# Patient Record
Sex: Female | Born: 1975 | Race: Black or African American | Hispanic: No | Marital: Married | Smoking: Never smoker
Health system: Southern US, Community
[De-identification: ages and names within clinical notes are randomized; demographics above are authoritative.]

## PROBLEM LIST (undated history)

## (undated) ENCOUNTER — Inpatient Hospital Stay (HOSPITAL_COMMUNITY): Payer: Self-pay

## (undated) DIAGNOSIS — O09529 Supervision of elderly multigravida, unspecified trimester: Secondary | ICD-10-CM

## (undated) DIAGNOSIS — D497 Neoplasm of unspecified behavior of endocrine glands and other parts of nervous system: Secondary | ICD-10-CM

## (undated) HISTORY — DX: Supervision of elderly multigravida, unspecified trimester: O09.529

## (undated) HISTORY — DX: Neoplasm of unspecified behavior of endocrine glands and other parts of nervous system: D49.7

## (undated) HISTORY — PX: NO PAST SURGERIES: SHX2092

---

## 2003-03-15 ENCOUNTER — Ambulatory Visit (HOSPITAL_COMMUNITY): Admission: RE | Admit: 2003-03-15 | Discharge: 2003-03-15 | Payer: Self-pay | Admitting: Internal Medicine

## 2003-03-16 ENCOUNTER — Encounter: Payer: Self-pay | Admitting: Internal Medicine

## 2003-04-09 ENCOUNTER — Ambulatory Visit (HOSPITAL_COMMUNITY): Admission: RE | Admit: 2003-04-09 | Discharge: 2003-04-09 | Payer: Self-pay | Admitting: Internal Medicine

## 2003-04-09 ENCOUNTER — Encounter: Payer: Self-pay | Admitting: Internal Medicine

## 2003-05-17 ENCOUNTER — Other Ambulatory Visit: Admission: RE | Admit: 2003-05-17 | Discharge: 2003-05-17 | Payer: Self-pay

## 2003-07-15 ENCOUNTER — Other Ambulatory Visit: Admission: RE | Admit: 2003-07-15 | Discharge: 2003-07-15 | Payer: Self-pay | Admitting: Family Medicine

## 2006-10-01 ENCOUNTER — Inpatient Hospital Stay (HOSPITAL_COMMUNITY): Admission: AD | Admit: 2006-10-01 | Discharge: 2006-10-01 | Payer: Self-pay | Admitting: Obstetrics & Gynecology

## 2006-10-01 ENCOUNTER — Ambulatory Visit: Payer: Self-pay | Admitting: *Deleted

## 2006-10-01 ENCOUNTER — Ambulatory Visit (HOSPITAL_COMMUNITY): Admission: RE | Admit: 2006-10-01 | Discharge: 2006-10-01 | Payer: Self-pay | Admitting: Obstetrics & Gynecology

## 2006-10-09 ENCOUNTER — Ambulatory Visit: Payer: Self-pay | Admitting: *Deleted

## 2006-10-16 ENCOUNTER — Ambulatory Visit: Payer: Self-pay | Admitting: Obstetrics & Gynecology

## 2006-10-18 ENCOUNTER — Ambulatory Visit: Payer: Self-pay | Admitting: Obstetrics & Gynecology

## 2006-10-23 ENCOUNTER — Ambulatory Visit: Payer: Self-pay | Admitting: Obstetrics and Gynecology

## 2006-10-23 ENCOUNTER — Inpatient Hospital Stay (HOSPITAL_COMMUNITY): Admission: AD | Admit: 2006-10-23 | Discharge: 2006-10-26 | Payer: Self-pay | Admitting: Gynecology

## 2006-10-23 ENCOUNTER — Ambulatory Visit: Payer: Self-pay | Admitting: Obstetrics & Gynecology

## 2006-10-24 ENCOUNTER — Encounter (INDEPENDENT_AMBULATORY_CARE_PROVIDER_SITE_OTHER): Payer: Self-pay | Admitting: Specialist

## 2007-06-17 ENCOUNTER — Ambulatory Visit: Payer: Self-pay | Admitting: Internal Medicine

## 2007-06-17 ENCOUNTER — Encounter (INDEPENDENT_AMBULATORY_CARE_PROVIDER_SITE_OTHER): Payer: Self-pay | Admitting: Nurse Practitioner

## 2007-06-17 LAB — CONVERTED CEMR LAB
ALT: 34 units/L (ref 0–35)
Albumin: 4.2 g/dL (ref 3.5–5.2)
Alkaline Phosphatase: 54 units/L (ref 39–117)
CO2: 18 meq/L — ABNORMAL LOW (ref 19–32)
Potassium: 4 meq/L (ref 3.5–5.3)
Sodium: 141 meq/L (ref 135–145)
Total Bilirubin: 0.5 mg/dL (ref 0.3–1.2)
Total Protein: 7.9 g/dL (ref 6.0–8.3)

## 2007-06-27 ENCOUNTER — Ambulatory Visit: Payer: Self-pay | Admitting: Family Medicine

## 2007-06-27 ENCOUNTER — Encounter (INDEPENDENT_AMBULATORY_CARE_PROVIDER_SITE_OTHER): Payer: Self-pay | Admitting: Nurse Practitioner

## 2007-06-27 LAB — CONVERTED CEMR LAB
HDL: 72 mg/dL (ref >39)
LDL Cholesterol: 107 mg/dL — ABNORMAL HIGH (ref 0–99)
Total CHOL/HDL Ratio: 2.6

## 2007-07-23 ENCOUNTER — Ambulatory Visit: Payer: Self-pay | Admitting: Internal Medicine

## 2007-07-28 ENCOUNTER — Ambulatory Visit: Payer: Self-pay | Admitting: *Deleted

## 2007-08-12 ENCOUNTER — Ambulatory Visit: Payer: Self-pay | Admitting: Family Medicine

## 2007-08-13 ENCOUNTER — Encounter (INDEPENDENT_AMBULATORY_CARE_PROVIDER_SITE_OTHER): Payer: Self-pay | Admitting: Nurse Practitioner

## 2007-08-13 ENCOUNTER — Ambulatory Visit: Payer: Self-pay | Admitting: Internal Medicine

## 2007-08-13 LAB — CONVERTED CEMR LAB
CO2: 22 meq/L (ref 19–32)
Chloride: 108 meq/L (ref 96–112)
Glucose, Bld: 98 mg/dL (ref 70–99)
Lymphocytes Relative: 55 % — ABNORMAL HIGH (ref 12–46)
Lymphs Abs: 2.8 10*3/uL (ref 0.7–4.0)
Neutro Abs: 1.9 10*3/uL (ref 1.7–7.7)
Neutrophils Relative %: 38 % — ABNORMAL LOW (ref 43–77)
Platelets: 234 10*3/uL (ref 150–400)
Potassium: 3.9 meq/L (ref 3.5–5.3)
RBC: 4.19 M/uL (ref 3.87–5.11)
Sodium: 143 meq/L (ref 135–145)
T3, Total: 131.1 ng/dL (ref 80.0–204.0)
WBC: 5 10*3/uL (ref 4.0–10.5)

## 2007-11-11 ENCOUNTER — Ambulatory Visit: Payer: Self-pay | Admitting: Internal Medicine

## 2008-03-12 ENCOUNTER — Ambulatory Visit: Payer: Self-pay | Admitting: Obstetrics & Gynecology

## 2008-08-05 ENCOUNTER — Encounter (INDEPENDENT_AMBULATORY_CARE_PROVIDER_SITE_OTHER): Payer: Self-pay | Admitting: Family Medicine

## 2008-08-05 ENCOUNTER — Ambulatory Visit: Payer: Self-pay | Admitting: Family Medicine

## 2008-08-05 ENCOUNTER — Ambulatory Visit (HOSPITAL_COMMUNITY): Admission: RE | Admit: 2008-08-05 | Discharge: 2008-08-05 | Payer: Self-pay | Admitting: Family Medicine

## 2008-08-05 LAB — CONVERTED CEMR LAB: Whiff Test: NEGATIVE

## 2008-09-16 ENCOUNTER — Encounter: Payer: Self-pay | Admitting: *Deleted

## 2009-01-21 ENCOUNTER — Telehealth: Payer: Self-pay | Admitting: Family Medicine

## 2009-01-24 ENCOUNTER — Ambulatory Visit: Payer: Self-pay | Admitting: Family Medicine

## 2009-01-24 ENCOUNTER — Encounter: Payer: Self-pay | Admitting: Family Medicine

## 2009-01-25 ENCOUNTER — Encounter: Payer: Self-pay | Admitting: *Deleted

## 2009-01-27 ENCOUNTER — Encounter (INDEPENDENT_AMBULATORY_CARE_PROVIDER_SITE_OTHER): Payer: Self-pay | Admitting: Family Medicine

## 2009-01-27 ENCOUNTER — Ambulatory Visit (HOSPITAL_COMMUNITY): Admission: RE | Admit: 2009-01-27 | Discharge: 2009-01-27 | Payer: Self-pay | Admitting: Family Medicine

## 2009-01-31 ENCOUNTER — Encounter: Payer: Self-pay | Admitting: *Deleted

## 2009-02-15 ENCOUNTER — Encounter: Payer: Self-pay | Admitting: Family Medicine

## 2009-02-15 ENCOUNTER — Ambulatory Visit: Payer: Self-pay | Admitting: Family Medicine

## 2009-02-15 DIAGNOSIS — E039 Hypothyroidism, unspecified: Secondary | ICD-10-CM | POA: Insufficient documentation

## 2009-02-15 LAB — CONVERTED CEMR LAB
HCT: 32.7 % — ABNORMAL LOW (ref 36.0–46.0)
MCHC: 33 g/dL (ref 30.0–36.0)
MCV: 92.1 fL (ref 78.0–100.0)
Platelets: 115 10*3/uL — ABNORMAL LOW (ref 150–400)
RDW: 13.6 % (ref 11.5–15.5)
TSH: 0.659 microintl units/mL (ref 0.350–4.500)
WBC: 6.1 10*3/uL (ref 4.0–10.5)

## 2009-02-17 ENCOUNTER — Encounter: Payer: Self-pay | Admitting: Family Medicine

## 2009-02-17 ENCOUNTER — Telehealth: Payer: Self-pay | Admitting: Family Medicine

## 2009-02-17 ENCOUNTER — Encounter (INDEPENDENT_AMBULATORY_CARE_PROVIDER_SITE_OTHER): Payer: Self-pay | Admitting: *Deleted

## 2009-03-01 ENCOUNTER — Encounter: Payer: Self-pay | Admitting: Family Medicine

## 2009-03-01 ENCOUNTER — Ambulatory Visit: Payer: Self-pay | Admitting: Family Medicine

## 2009-03-07 ENCOUNTER — Encounter: Payer: Self-pay | Admitting: *Deleted

## 2009-03-08 ENCOUNTER — Ambulatory Visit: Payer: Self-pay | Admitting: Family Medicine

## 2009-03-15 ENCOUNTER — Ambulatory Visit: Payer: Self-pay | Admitting: Family Medicine

## 2009-03-15 ENCOUNTER — Encounter: Payer: Self-pay | Admitting: Family Medicine

## 2009-03-15 DIAGNOSIS — N942 Vaginismus: Secondary | ICD-10-CM | POA: Insufficient documentation

## 2009-03-16 ENCOUNTER — Encounter: Payer: Self-pay | Admitting: *Deleted

## 2009-03-23 ENCOUNTER — Ambulatory Visit: Payer: Self-pay | Admitting: Family Medicine

## 2009-03-29 ENCOUNTER — Encounter: Payer: Self-pay | Admitting: Family Medicine

## 2009-03-29 ENCOUNTER — Ambulatory Visit: Payer: Self-pay | Admitting: Obstetrics & Gynecology

## 2009-03-30 ENCOUNTER — Inpatient Hospital Stay (HOSPITAL_COMMUNITY): Admission: AD | Admit: 2009-03-30 | Discharge: 2009-04-01 | Payer: Self-pay | Admitting: Obstetrics & Gynecology

## 2009-03-30 ENCOUNTER — Ambulatory Visit: Payer: Self-pay | Admitting: Advanced Practice Midwife

## 2009-03-30 ENCOUNTER — Ambulatory Visit: Payer: Self-pay | Admitting: Family Medicine

## 2009-04-06 ENCOUNTER — Telehealth: Payer: Self-pay | Admitting: *Deleted

## 2009-05-18 ENCOUNTER — Ambulatory Visit: Payer: Self-pay | Admitting: Family Medicine

## 2009-05-20 ENCOUNTER — Ambulatory Visit: Payer: Self-pay | Admitting: Family Medicine

## 2009-05-26 ENCOUNTER — Ambulatory Visit: Payer: Self-pay | Admitting: Family Medicine

## 2009-09-13 ENCOUNTER — Encounter: Admission: RE | Admit: 2009-09-13 | Discharge: 2009-09-13 | Payer: Self-pay | Admitting: Gastroenterology

## 2010-01-03 ENCOUNTER — Ambulatory Visit: Payer: Self-pay | Admitting: Family Medicine

## 2010-01-08 ENCOUNTER — Encounter: Payer: Self-pay | Admitting: Family Medicine

## 2010-01-22 ENCOUNTER — Encounter: Payer: Self-pay | Admitting: Family Medicine

## 2010-04-18 ENCOUNTER — Ambulatory Visit: Payer: Self-pay | Admitting: Family Medicine

## 2010-05-26 ENCOUNTER — Encounter: Payer: Self-pay | Admitting: Family Medicine

## 2010-07-11 ENCOUNTER — Encounter
Admission: RE | Admit: 2010-07-11 | Discharge: 2010-07-11 | Payer: Self-pay | Source: Home / Self Care | Attending: Obstetrics & Gynecology | Admitting: Obstetrics & Gynecology

## 2010-08-05 ENCOUNTER — Encounter: Payer: Self-pay | Admitting: Internal Medicine

## 2010-08-10 ENCOUNTER — Other Ambulatory Visit: Payer: Self-pay | Admitting: Neurosurgery

## 2010-08-10 DIAGNOSIS — D497 Neoplasm of unspecified behavior of endocrine glands and other parts of nervous system: Secondary | ICD-10-CM

## 2010-08-13 LAB — CONVERTED CEMR LAB
Basophils Absolute: 0 10*3/uL (ref 0.0–0.1)
Chlamydia, DNA Probe: NEGATIVE
Hemoglobin: 12 g/dL (ref 12.0–15.0)
Lymphocytes Relative: 46 % (ref 12–46)
Lymphs Abs: 2.4 10*3/uL (ref 0.7–4.0)
Monocytes Absolute: 0.4 10*3/uL (ref 0.1–1.0)
Neutro Abs: 2.4 10*3/uL (ref 1.7–7.7)
RBC: 4.16 M/uL (ref 3.87–5.11)
RDW: 13.5 % (ref 11.5–15.5)
Rubella: 190.5 intl units/mL — ABNORMAL HIGH
WBC: 5.2 10*3/uL (ref 4.0–10.5)

## 2010-08-15 NOTE — Miscellaneous (Signed)
Summary: Changing prob list   Clinical Lists Changes  Problems: Removed problem of ABDOMINAL PAIN, EPIGASTRIC (ICD-789.06) Removed problem of TB OF SKIN&SUBCUTANEOUS CELLULAR TISSUE-OTH TEST (ICD-017.06) Removed problem of POSTPARTUM EXAMINATION, NORMAL (ICD-V24.2) Removed problem of BREAST PAIN, LEFT (ICD-611.71) Removed problem of UTERINE SIZE DATE DISCREPANCY ANTPRTM COND/COMPL (AOZ-308.65) Removed problem of PREGNANCY, NORMAL, MULTIGRAVIDA (ICD-V22.1) Removed problem of PELVIC  PAIN (ICD-789.09) Removed problem of AMENORRHEA (ICD-626.0) Removed problem of FEMALE INFERTILITY (ICD-628.9)

## 2010-08-15 NOTE — Assessment & Plan Note (Signed)
Summary: flu shot/eo  Nurse Visit  FLU SHOT GIVEN TODAY.Jimmy Footman, CMA  April 18, 2010 12:03 PM   Vitals Entered By: Jimmy Footman, CMA (April 18, 2010 12:02 PM) CC: flu shot   Allergies: No Known Drug Allergies  Immunizations Administered:  Influenza Vaccine # 1:    Vaccine Type: Fluvax Non-MCR    Site: right deltoid    Mfr: Sanofi Pasteur    Dose: 0.5 ml    Route: IM    Given by: Jimmy Footman, CMA    Exp. Date: 01/10/2011    Lot #: [id number]    VIS given: 02/07/10 version given April 18, 2010.  Flu Vaccine Consent Questions:    Do you have a history of severe allergic reactions to this vaccine? no    Any prior history of allergic reactions to egg and/or gelatin? no    Do you have a sensitivity to the preservative Thimersol? no    Do you have a past history of Guillan-Barre Syndrome? no    Do you currently have an acute febrile illness? no    Have you ever had a severe reaction to latex? no    Vaccine information given and explained to patient? yes    Are you currently pregnant? no  Orders Added: 1)  Influenza Vaccine NON MCR [00028] 2)  Admin 1st Vaccine [25956]

## 2010-08-15 NOTE — Assessment & Plan Note (Signed)
Summary: Fertility   Vital Signs:  Patient profile:   35 year old female Height:      66 inches Weight:      148 pounds BMI:     23.97 Temp:     97.5 degrees F oral Pulse rate:   81 / minute BP sitting:   106 / 65  (left arm) Cuff size:   regular  Vitals Entered By: Tessie Fass CMA (January 03, 2010 2:15 PM) CC: Fertility treatment Is Patient Diabetic? No Pain Assessment Patient in pain? no        Primary Care Provider:  Dreonna Hussein MD  CC:  Fertility treatment.  History of Present Illness: 35 y/o G2P2002 would like to discuss fertility.  She reports taking medicine in [place] to conceive.  She conceived previous 2 pregnancies by what seems like Clomid and bromocriptine.  She has a history of multinodule goitor, but has been off of thyroid medications for years.  Her husband has 2 children from previous marriage, so most likley not the source of fertility.  Pt would like me to prescribe medicine to help her conceive.  She is not sure when she would like to try to have another baby.  She goes from saying when youngest child, Jeani Hawking, is 15 y/o (currently 9-mo) to saying that she my try now.    No periods since  delivery in 03/2009.   Pt is still breast feeding.    Habits & Providers  Alcohol-Tobacco-Diet     Tobacco Status: never  Current Medications (verified): 1)  Zantac 150 Mg Tabs (Ranitidine Hcl) .... One Tablet By Mouth After Meal As Needed Heartburn 2)  P D Natal Vitamins/folic Acid  Tabs (Prenatal Multivit-Min-Fe-Fa) .... One Tablet By Mouth Daily 3)  Iron 325 (65 Fe) Mg Tabs (Ferrous Sulfate) .... One Tablet By Mouth Two Times A Day For Anemia 4)  Colace 100 Mg Caps (Docusate Sodium) .... One Tablet By Mouth Two Times A Day For Constipation 5)  Nu-Iron 150 Mg Caps (Polysaccharide Iron Complex) .... One Tablet By Mouth Daily  Allergies (verified): No Known Drug Allergies  Past History:  Social History: Last updated: 08/05/2008 Lives with husband and daughter Berline Chough.  From [place] Non smoker, nondrinker, no drugs  Risk Factors: Smoking Status: never (01/03/2010) Packs/Day: n/a (03/30/2009)  Past Medical History: [id number] NSVD at term without complications Hstory of Multinodular goitor History of conception through Clomid (in [place])  Physical Exam  General:  Well-developed,well-nourished,in no acute distress; alert,appropriate and cooperative throughout examination Neck:  + goitor Lungs:  Normal respiratory effort, chest expands symmetrically. Lungs are clear to auscultation, no crackles or wheezes. Heart:  Normal rate and regular rhythm. S1 and S2 normal without gallop, murmur, click, rub or other extra sounds. Neurologic:  alert & oriented X3.   Psych:  Cognition and judgment appear intact. Alert and cooperative with normal attention span and concentration. No apparent delusions, illusions, hallucinations   Impression & Recommendations:  Problem # 1:  FEMALE INFERTILITY (ICD-628.9) Pt would like assistance in conceiving.  It seems she may have been treated with Clomid in [place], each time she conceived.  She was followed by first pregnancy through health dept and 2[facility] pregnancy at [facility].  She delivered by NSVD x 2 withoiut complications.  I discussed at length that I am no comfortable prescribing clomid.  We discussed increased risk of multiple conceptions/birts with Clomid.  Pt states she knows this.  Told pt that I thought she would be better  served being followed by OBGYN or fertility specialist.  I would be happy to do referral.  Pt agreed.  In the meantime I would like pt to conintue pnvwith folic acid.  Will refill today.   will call pt for appt after referral is made.   Orders: [facility]- Est Level  3 ([address])  Problem # 2:  HYPOTHYROIDISM (ICD-244.9) I would like to check TSH, esp if pt plans on trying to become pregnant again.   Future Orders: TSH-[facility] ([id number]) ... 12/23/2010  Complete Medication List: 1)  Zantac 150 Mg Tabs  (Ranitidine hcl) .... One tablet by mouth after meal as needed heartburn 2)  P D Natal Vitamins/folic Acid Tabs (Prenatal multivit-min-fe-fa) .... One tablet by mouth daily 3)  Iron 325 (65 Fe) Mg Tabs (Ferrous sulfate) .... One tablet by mouth two times a day for anemia 4)  Colace 100 Mg Caps (Docusate sodium) .... One tablet by mouth two times a day for constipation 5)  Nu-iron 150 Mg Caps (Polysaccharide iron complex) .... One tablet by mouth daily  Patient Instructions: 1)  Please schedule a follow-up appointment as needed if you want to discuss prenatal issues .  2)  SITZ bath 3)  Preparation H Prescriptions: P D NATAL VITAMINS/FOLIC ACID  TABS (PRENATAL MULTIVIT-MIN-FE-FA) one tablet by mouth daily  #30 x 12   Entered and Authorized by:   Angeline Slim MD   Signed by:   Angeline Slim MD on 01/03/2010   Method used:   Electronically to        [facility]. [phone]* (retail)       [address]       [place], [place]  [address]       Ph: [phone]       Fax: [phone]   RxID:   [id number]

## 2010-08-15 NOTE — Letter (Signed)
Summary: Generic Letter  [facility]  [address]   [place], [place] [address]   Phone: [phone]  Fax: [phone]    01/22/2010  [facility] [address] [place], [place]  [address]  To whom it may concern:  I am writing on behalf of my patient, Shelley Hardy.  Please be aware that she has a daugther, Judyann Munson, who has asthma.       Sincerely,   Bria Sparr MD  Appended Document: Generic Letter mailed

## 2010-08-15 NOTE — Letter (Signed)
Summary: Generic Letter: Fertility referral  [facility]  [address]   [place], [place] [address]   Phone: [phone]  Fax: [phone]    01/08/2010  [facility] [address] [place], [place]  [address]  Dear Shelley Hardy,  Per our conversation during your last office visit, I am sending you my recommendations for you to choose from for Fertility Care.  These practices have a few female OBGYN for you to choose from.  Please call them to schedule an appointment at your earliest convenience.    [address] [place], [place] [phone]   [facility] [address] [place], [place] [address] [phone]  Sincerely,   Gorden Stthomas MD  Appended Document: Generic Letter: Fertility referral mailed

## 2010-10-20 LAB — CBC
HCT: 30.4 % — ABNORMAL LOW (ref 36.0–46.0)
HCT: 37.3 % (ref 36.0–46.0)
MCHC: 34.6 g/dL (ref 30.0–36.0)
MCV: 94.2 fL (ref 78.0–100.0)
Platelets: 82 10*3/uL — ABNORMAL LOW (ref 150–400)
Platelets: 93 10*3/uL — ABNORMAL LOW (ref 150–400)
RDW: 13.6 % (ref 11.5–15.5)
RDW: 13.7 % (ref 11.5–15.5)
WBC: 9.9 10*3/uL (ref 4.0–10.5)
WBC: 6.7 10*3/uL (ref 4.0–10.5)

## 2010-10-20 LAB — RPR: RPR Ser Ql: NONREACTIVE

## 2010-10-21 LAB — GLUCOSE, CAPILLARY: Glucose-Capillary: 121 mg/dL — ABNORMAL HIGH (ref 70–99)

## 2010-11-12 ENCOUNTER — Inpatient Hospital Stay (HOSPITAL_COMMUNITY): Payer: Medicaid Other

## 2010-11-12 ENCOUNTER — Inpatient Hospital Stay (HOSPITAL_COMMUNITY)
Admission: AD | Admit: 2010-11-12 | Discharge: 2010-11-12 | Disposition: A | Discharge status: Home / Self Care | Payer: Medicaid Other | Source: Ambulatory Visit | Attending: Family Medicine | Admitting: Family Medicine

## 2010-11-12 ENCOUNTER — Inpatient Hospital Stay (HOSPITAL_COMMUNITY)
Admission: AD | Admit: 2010-11-12 | Discharge: 2010-11-12 | Disposition: A | Discharge status: Home / Self Care | Payer: Medicaid Other | Source: Ambulatory Visit | Attending: Obstetrics and Gynecology | Admitting: Obstetrics and Gynecology

## 2010-11-12 DIAGNOSIS — O209 Hemorrhage in early pregnancy, unspecified: Secondary | ICD-10-CM | POA: Insufficient documentation

## 2010-11-12 LAB — WET PREP, GENITAL: Clue Cells Wet Prep HPF POC: NONE SEEN

## 2010-11-12 LAB — URINALYSIS, ROUTINE W REFLEX MICROSCOPIC
Bilirubin Urine: NEGATIVE
Ketones, ur: NEGATIVE mg/dL
Nitrite: NEGATIVE
Protein, ur: 30 mg/dL — AB
pH: 6.5 (ref 5.0–8.0)

## 2010-11-12 LAB — CBC
Hemoglobin: 12.1 g/dL (ref 12.0–15.0)
Hemoglobin: 12 g/dL (ref 12.0–15.0)
MCHC: 32.4 g/dL (ref 30.0–36.0)
MCHC: 33.5 g/dL (ref 30.0–36.0)
RDW: 12.6 % (ref 11.5–15.5)
RDW: 12.6 % (ref 11.5–15.5)
WBC: 3.9 10*3/uL — ABNORMAL LOW (ref 4.0–10.5)
WBC: 6 10*3/uL (ref 4.0–10.5)

## 2010-11-12 LAB — URINE MICROSCOPIC-ADD ON

## 2010-11-12 LAB — ABO/RH: ABO/RH(D): A POS

## 2010-11-12 LAB — HCG, QUANTITATIVE, PREGNANCY: hCG, Beta Chain, Quant, S: 111 m[IU]/mL — ABNORMAL HIGH (ref <5)

## 2010-11-12 LAB — POCT PREGNANCY, URINE: Preg Test, Ur: POSITIVE

## 2010-11-13 LAB — GC/CHLAMYDIA PROBE AMP, GENITAL: Chlamydia, DNA Probe: NEGATIVE

## 2010-11-14 ENCOUNTER — Inpatient Hospital Stay (HOSPITAL_COMMUNITY)
Admission: AD | Admit: 2010-11-14 | Discharge: 2010-11-14 | Disposition: A | Discharge status: Home / Self Care | Payer: Medicaid Other | Source: Ambulatory Visit | Attending: Obstetrics & Gynecology | Admitting: Obstetrics & Gynecology

## 2010-11-14 DIAGNOSIS — O039 Complete or unspecified spontaneous abortion without complication: Secondary | ICD-10-CM

## 2010-11-14 LAB — HCG, QUANTITATIVE, PREGNANCY: hCG, Beta Chain, Quant, S: 15 m[IU]/mL — ABNORMAL HIGH (ref <5)

## 2010-11-28 NOTE — Group Therapy Note (Signed)
NAME:  Shelley Hardy, Shelley Hardy NO.:  [id number]   MEDICAL RECORD NO.:  [id number]          PATIENT TYPE:  WOC   LOCATION:  [facility]                   FACILITY:  [facility]   PHYSICIAN:  Elsie Lincoln, MD      DATE OF BIRTH:  [birth year]   DATE OF SERVICE:  03/12/2008                                  CLINIC NOTE   HISTORY OF PRESENT ILLNESS:  The patient is a 35 year old female who  presents for infertility.  She has been trying to get pregnant for a  year.  The patient had a baby with [place] approximately 17 months ago and is  still breast-feeding.  Of note, she does have a history of  hyperprolactinemia, although this is not in her prenatal records.  She  had noted discharge from her nipples before she became pregnant and was  on what sounds like bromocriptine and maybe even Clomid to get pregnant  with this pregnancy in [place].  The patient also has a  history of multinodular goiter.  She states she had a TSH drawn at  [facility] 3 months ago, and it was normal.  She denies any other  medical problems.  Her husband has fathered 2 other children, so I do  not think there is a semen issue.  The patient has had some irregular  periods this year.  This could be due to the breast-feeding or due to  other endocrine problem.  The patient would like to stop breast-feeding,  but she feels that at night it is very difficult.  We gave her some  pointers on how to isolate her breasts and not feed the baby several  times in the night.  Not only is this bad forsleep habits, but also her  baby's teeth having milk exposed.  The patient has agreed to quit breast-  feeding for a month and come back for a prolactin and TSH.  If these are  abnormal, most likely we will have to refer them to an endocrinologist  to help [place] with this situation.  She did pay $200  today for her  infertility workup and the labs should be included in that.  She is up-  to-date for Pap smear per the patient at  [facility].   MEDICATIONS:  Prenatal vitamins and ibuprofen.   ALLERGIES:  None familial.   FAMILY HISTORY:  No history of ovarian cancer, breast cancer,  endometrial cancer, or colon cancer.   ASSESSMENT/PLAN:  A 31-year female with infertility.  1. Stop breast-feeding.  2. Will come back in 1 month for TSH and prolactin.  3. Will call with results.  4. As necessary.           ______________________________  Elsie Lincoln, MD     KL/MEDQ  D:  03/12/2008  T:  03/12/2008  Job:  [id number]

## 2010-11-29 ENCOUNTER — Encounter (INDEPENDENT_AMBULATORY_CARE_PROVIDER_SITE_OTHER): Payer: Medicaid Other | Admitting: Obstetrics and Gynecology

## 2010-11-29 DIAGNOSIS — N643 Galactorrhea not associated with childbirth: Secondary | ICD-10-CM

## 2010-11-29 DIAGNOSIS — O039 Complete or unspecified spontaneous abortion without complication: Secondary | ICD-10-CM

## 2010-11-30 NOTE — Group Therapy Note (Signed)
NAME:  Shelley Hardy, Shelley Hardy NO.:  [id number]  MEDICAL RECORD NO.:  [id number]           PATIENT TYPE:  A  LOCATION:  [facility]                   FACILITY:  [facility]  PHYSICIAN:  Argentina Donovan, MD        DATE OF BIRTH:  [birth year]  DATE OF SERVICE:  11/29/2010                                 CLINIC NOTE  The patient is a 35 year old Arab female from the [place], gravida 3, para 2-0-1-2, who recently had a spontaneous miscarriage, went into the MAU on April 29 and had an ultrasound, at that time, it looked like a failed pregnancy, went back on May 4 and found out that she had a beta HCG of 15.  The patient has stopped bleeding, however, she starting to have milk in her breasts.  Interestingly enough several months ago, she began having galactorrhea.  She went and saw Dr. Seymour Bars who sent her for an MRI for the pituitary microadenoma and placed her on medication.  I am not sure which medication they put her on exactly, whether it was bromocriptine, Parlodel, or what.  In any case, she is going to bring in the bottle the next time she comes.  She is known in the past to be hypothyroid, although, she is not on any thyroid medication.  1. I am going to get quantitative beta to make sure it is down to     zero. 2. Repeat her prolactin. 3. Check her thyroid.  I am going to have her come back in 2 weeks.  In 3 days, she is to have that she has previously scheduled for repeat MRI.  The last prolactin apparently she had was normal.  However, once she found out that she was pregnant, she stop the medication she was taking for the microadenoma and she has already had one MRI that showed 10 x 6 x 10 hypo-enhancing mass consistent with pituitary adenoma, so we will see what the prolactin shows. We will follow her up on the level and if there is a significant change and it has gotten larger, I think we will probably refer her to a Neurosurgeon.  IMPRESSION:  Microadenoma of the pituitary  gland, possible hypothyroid, complete abortion.          ______________________________ Argentina Donovan, MD    PR/MEDQ  D:  11/29/2010  T:  11/30/2010  Job:  [id number]

## 2010-12-07 ENCOUNTER — Other Ambulatory Visit: Payer: Self-pay

## 2010-12-14 ENCOUNTER — Ambulatory Visit (INDEPENDENT_AMBULATORY_CARE_PROVIDER_SITE_OTHER): Payer: Medicaid Other | Admitting: Obstetrics and Gynecology

## 2010-12-14 DIAGNOSIS — D352 Benign neoplasm of pituitary gland: Secondary | ICD-10-CM

## 2010-12-14 DIAGNOSIS — D353 Benign neoplasm of craniopharyngeal duct: Secondary | ICD-10-CM

## 2010-12-15 NOTE — Group Therapy Note (Signed)
NAMEMarland Hardy  [name], [name] NO.:  [id number]  MEDICAL RECORD NO.:  [id number]           PATIENT TYPE:  A  [facility]ATION:  [facility]                   FACILITY:  [facility]  PHYSICIAN:  Argentina Donovan, MD        DATE OF BIRTH:  April 10, 1976  DATE OF SERVICE:  12/14/2010                                 CLINIC NOTE  The patient is a 35 year old [place] lady, gravida 3, para 2-0-1-2 who had a miscarriage in April who has then recently had a quantitative beta of less than 2.  She thought she was hypothyroid, but her T4 and T3's were absolutely normal, so she is euthyroid.  However, her prolactin level was 97.2, it had been about 1500.  She thinks when she was at Dr. Sharol Roussel office prior to her pregnancy when they started her on bromocriptine.  They gave her 12.5 mg a day for a week and then she was able to take the 25.  We are going to do the same thing.  We are going to start her for 2 weeks on 12.5, then put her back on 25.  We will go ahead and continue doing that until she starts ovulate and I told her not to get pregnant until she has had a period or two on her own, and then to stop the prolactin when she is pregnant.  They would not do her a repeat MRI with her Medicaid because I think that it was too soon after the previous one, so I would repeat it either in a year or after her pregnancy, if she is looking off to get pregnant quickly.  Impression is microadenoma of the pituitary gland.          ______________________________ Argentina Donovan, MD    PR/MEDQ  D:  12/14/2010  T:  12/15/2010  Job:  [id number]

## 2010-12-20 ENCOUNTER — Ambulatory Visit
Admission: RE | Admit: 2010-12-20 | Discharge: 2010-12-20 | Disposition: A | Discharge status: Home / Self Care | Payer: Medicaid Other | Source: Ambulatory Visit | Attending: Neurosurgery | Admitting: Neurosurgery

## 2010-12-20 DIAGNOSIS — D497 Neoplasm of unspecified behavior of endocrine glands and other parts of nervous system: Secondary | ICD-10-CM

## 2011-02-23 ENCOUNTER — Other Ambulatory Visit: Payer: Self-pay | Admitting: Family Medicine

## 2011-02-25 NOTE — Telephone Encounter (Signed)
Refill request

## 2011-05-25 ENCOUNTER — Other Ambulatory Visit: Payer: Medicaid Other

## 2011-05-25 LAB — POCT PREGNANCY, URINE: Preg Test, Ur: POSITIVE

## 2011-05-28 ENCOUNTER — Inpatient Hospital Stay (HOSPITAL_COMMUNITY)
Admission: AD | Admit: 2011-05-28 | Discharge: 2011-05-28 | Disposition: A | Discharge status: Home / Self Care | Payer: Medicaid Other | Source: Ambulatory Visit | Attending: Obstetrics & Gynecology | Admitting: Obstetrics & Gynecology

## 2011-05-28 ENCOUNTER — Inpatient Hospital Stay (HOSPITAL_COMMUNITY): Payer: Medicaid Other

## 2011-05-28 ENCOUNTER — Encounter (HOSPITAL_COMMUNITY): Payer: Self-pay

## 2011-05-28 DIAGNOSIS — R109 Unspecified abdominal pain: Secondary | ICD-10-CM | POA: Insufficient documentation

## 2011-05-28 DIAGNOSIS — O039 Complete or unspecified spontaneous abortion without complication: Secondary | ICD-10-CM | POA: Insufficient documentation

## 2011-05-28 LAB — CBC
HCT: 36.7 % (ref 36.0–46.0)
MCH: 29.7 pg (ref 26.0–34.0)
MCHC: 33 g/dL (ref 30.0–36.0)
MCV: 90 fL (ref 78.0–100.0)
RDW: 13.4 % (ref 11.5–15.5)

## 2011-05-28 LAB — URINALYSIS, ROUTINE W REFLEX MICROSCOPIC
Bilirubin Urine: NEGATIVE
Ketones, ur: NEGATIVE mg/dL
Nitrite: NEGATIVE
Protein, ur: NEGATIVE mg/dL
Urobilinogen, UA: 0.2 mg/dL (ref 0.0–1.0)

## 2011-05-28 LAB — URINE MICROSCOPIC-ADD ON

## 2011-05-28 LAB — WET PREP, GENITAL: Clue Cells Wet Prep HPF POC: NONE SEEN

## 2011-05-28 NOTE — ED Provider Notes (Signed)
Attestation of Attending Supervision of Advanced Practitioner: Evaluation and management procedures were performed by the PA/NP/CNM/OB Fellow under my supervision/collaboration. Chart reviewed, and agree with management and plan.  Damonique Brunelle, M.D. 05/28/2011 1:15 PM   

## 2011-05-28 NOTE — ED Provider Notes (Signed)
History   Pt presents today c/o lower abd pain and vag bleeding that began yesterday. She states she also passed a large clot yesterday and she is concerned that she may be having another miscarriage. She denies fever, vag irritation, or any other sx at this time.   Chief Complaint  Patient presents with  . Vaginal Bleeding   HPI  OB History    Grav Para Term Preterm Abortions TAB SAB Ect Mult Living   4 2 2  0 1 0 1 0 0 2      History reviewed. No pertinent past medical history.  History reviewed. No pertinent past surgical history.  History reviewed. No pertinent family history.  History  Substance Use Topics  . Smoking status: Never Smoker   . Smokeless tobacco: Never Used  . Alcohol Use: No    Allergies: No Known Allergies  Prescriptions prior to admission  Medication Sig Dispense Refill  . bromocriptine (PARLODEL) 2.5 MG tablet Take 1.25 mg by mouth daily.        . Prenatal Multivit-Min-Fe-FA (P D NATAL VITAMINS/FOLIC ACID PO) Take by mouth daily.          Review of Systems  Constitutional: Negative for fever.  Gastrointestinal: Positive for abdominal pain. Negative for nausea, vomiting, diarrhea and constipation.  Genitourinary: Negative for dysuria, urgency and frequency.  Neurological: Negative for dizziness and headaches.  Psychiatric/Behavioral: Negative for depression and suicidal ideas.   Physical Exam   Blood pressure 124/79, pulse 80, temperature 98.5 F (36.9 C), temperature source Oral, resp. rate 16, height 5\' 6"  (1.676 m), weight 168 lb 9.6 oz (76.476 kg), last menstrual period 04/22/2011, SpO2 99.00%.  Physical Exam  Nursing note and vitals reviewed. Constitutional: She is oriented to person, place, and time. She appears well-developed and well-nourished. No distress.  HENT:  Head: Normocephalic and atraumatic.  Eyes: EOM are normal. Pupils are equal, round, and reactive to light.  GI: Soft. She exhibits no distension. There is no tenderness.  There is no rebound and no guarding.  Genitourinary: There is bleeding around the vagina. No vaginal discharge found.       Cervix Lg/closed. Moderate amount of bleeding noted.   Neurological: She is alert and oriented to person, place, and time.  Skin: Skin is warm and dry. She is not diaphoretic.  Psychiatric: She has a normal mood and affect. Her behavior is normal. Judgment and thought content normal.    MAU Course  Procedures  Wet prep and GC/Chlamydia cultures done.  Results for orders placed during the hospital encounter of 05/28/11 (from the past 24 hour(s))  URINALYSIS, ROUTINE W REFLEX MICROSCOPIC     Status: Abnormal   Collection Time   05/28/11  9:15 AM      Component Value Range   Color, Urine YELLOW  YELLOW    Appearance CLEAR  CLEAR    Specific Gravity, Urine 1.020  1.005 - 1.030    pH 7.5  5.0 - 8.0    Glucose, UA NEGATIVE  NEGATIVE (mg/dL)   Hgb urine dipstick LARGE (*) NEGATIVE    Bilirubin Urine NEGATIVE  NEGATIVE    Ketones, ur NEGATIVE  NEGATIVE (mg/dL)   Protein, ur NEGATIVE  NEGATIVE (mg/dL)   Urobilinogen, UA 0.2  0.0 - 1.0 (mg/dL)   Nitrite NEGATIVE  NEGATIVE    Leukocytes, UA TRACE (*) NEGATIVE   URINE MICROSCOPIC-ADD ON     Status: Normal   Collection Time   05/28/11  9:15 AM  Component Value Range   Squamous Epithelial / LPF RARE  RARE    WBC, UA 0-2  <3 (WBC/hpf)   RBC / HPF 21-50  <3 (RBC/hpf)  HCG, QUANTITATIVE, PREGNANCY     Status: Normal   Collection Time   05/28/11  9:20 AM      Component Value Range   hCG, Beta Chain, Quant, S 1  <5 (mIU/mL)  CBC     Status: Normal   Collection Time   05/28/11  9:20 AM      Component Value Range   WBC 4.5  4.0 - 10.5 (K/uL)   RBC 4.08  3.87 - 5.11 (MIL/uL)   Hemoglobin 12.1  12.0 - 15.0 (g/dL)   HCT 16.1  09.6 - 04.5 (%)   MCV 90.0  78.0 - 100.0 (fL)   MCH 29.7  26.0 - 34.0 (pg)   MCHC 33.0  30.0 - 36.0 (g/dL)   RDW 40.9  81.1 - 91.4 (%)   Platelets 213  150 - 400 (K/uL)  WET PREP, GENITAL      Status: Abnormal   Collection Time   05/28/11  9:34 AM      Component Value Range   Yeast, Wet Prep NONE SEEN  NONE SEEN    Trich, Wet Prep NONE SEEN  NONE SEEN    Clue Cells, Wet Prep NONE SEEN  NONE SEEN    WBC, Wet Prep HPF POC FEW (*) NONE SEEN    US shows no IUP or adnexal masses.  Assessment and Plan  Complete AB: pt had a positive urine preg test in the GYN clinic and now has a B-quant of 1. Discussed with pt at length. She will f/u in the infertility clinic. Discussed diet, activity, risks, and precautions.  Clinton Gallant. Lathyn Griggs III, DrHSc, MPAS, PA-C  05/28/2011, 9:34 AM   Henrietta Hoover, PA 05/28/11 1036

## 2011-05-28 NOTE — Progress Notes (Signed)
Patient states she started bleeding and having cramping yesterday, passed a clot yesterday. Bleeding and cramping continue. Pt states she had a SAB six months ago.

## 2011-05-29 LAB — GC/CHLAMYDIA PROBE AMP, GENITAL: Chlamydia, DNA Probe: NEGATIVE

## 2011-07-05 ENCOUNTER — Encounter: Payer: Self-pay | Admitting: Family

## 2011-07-05 ENCOUNTER — Ambulatory Visit (INDEPENDENT_AMBULATORY_CARE_PROVIDER_SITE_OTHER): Payer: Medicaid Other | Admitting: Family

## 2011-07-05 VITALS — BP 120/78 | HR 81 | Temp 97.7°F | Ht 66.0 in | Wt 170.4 lb

## 2011-07-05 DIAGNOSIS — O0990 Supervision of high risk pregnancy, unspecified, unspecified trimester: Secondary | ICD-10-CM

## 2011-07-05 DIAGNOSIS — E039 Hypothyroidism, unspecified: Secondary | ICD-10-CM

## 2011-07-05 DIAGNOSIS — E079 Disorder of thyroid, unspecified: Secondary | ICD-10-CM

## 2011-07-05 LAB — POCT PREGNANCY, URINE: Preg Test, Ur: POSITIVE

## 2011-07-05 MED ORDER — BROMOCRIPTINE MESYLATE 2.5 MG PO TABS: 2.5000 mg | ORAL_TABLET | Freq: Every day | ORAL | Status: DC

## 2011-07-05 NOTE — Progress Notes (Signed)
Pt states may be pregnant.

## 2011-07-05 NOTE — Progress Notes (Signed)
  Subjective:    Patient ID: Shelley Hardy, female    DOB: Mar 25, 1976, 35 y.o.   MRN: [id number]  HPI Seen in MAU on 11/12 and diagnosed with complete miscarriage;  BHCG was 1 on that day.  Pt here today reporting another pregnancy, believes she conceived on 06/13/11.  Denies pelvic pain or vaginal bleeding.   Review of Systems No current problems.    Objective:   Physical Exam  Constitutional: She is oriented to person, place, and time. She appears well-developed and well-nourished. No distress.  HENT:  Head: Normocephalic.  Neck: Normal range of motion. Neck supple.  Cardiovascular: Normal rate and regular rhythm.   Pulmonary/Chest: Effort normal and breath sounds normal.  Abdominal: Soft. There is no tenderness.  Neurological: She is alert and oriented to person, place, and time.          Assessment & Plan:  Pregnancy  Plan: Obtain BHCG F/U in MAU for any pelvic pain or vaginal bleeding.

## 2011-07-05 NOTE — Patient Instructions (Signed)
Pregnancy and Medications Most of the time, medicine a pregnant woman is taking does not enter the fetus, but sometimes it can. This may cause damage or birth defects. The risk of damage being done to a fetus is the greatest in the first few weeks of pregnancy. This is when major organs are developing. If you are taking any prescription, over-the-counter or herbal medicines, it is best to talk to your caregiver about the medications you are taking before getting pregnant. If you become pregnant, stop taking over-the-counter and herbal medications right away. Tell your caregiver what you were taking. Also, tell him/her medications, if any, you took before knowing you were pregnant. Never take any drugs during pregnancy unless your caregiver gives you permission. Other things like caffeine, vitamins, herbal teas and remedies can affect the growing fetus. Talk to your caregiver about cutting down on caffeine. Also, ask what type of vitamins you need to take. Never use any herbal product without talking to your caregiver first.  MEDICINES THAT ARE NOT SAFE TO TAKE DURING PREGNANCY   Category A - drugs that have been tested for safety during pregnancy and have been found to be safe. This includes drugs such as:   Folic acid.   Vitamin B6.   Thyroid medicine in moderation or in prescribed doses.   Category B - drugs that have been used a lot during pregnancy and do not appear to cause major birth defects or other problems. This includes drugs such as:   Some antibiotics.   Acetaminophen (Tylenol).   Aspartame (artificial sweetener).   Famotidine (Pepcid).   Prednisone (cortisone).   Insulin (for diabetes).   Ibuprofen (Advil, Motrin) before the third trimester. Pregnant women should not take ibuprofen during the last three months of pregnancy.   Category C - drugs that are more likely to cause problems for the mother or fetus. It also includes drugs for which safety studies have not been  finished. The majority of these drugs do not have safety studies in progress. These drugs often come with a warning that they should be used only if the benefits of taking them outweigh the risks. This is something a woman would need to carefully discuss with her caregiver. These drugs include:   Prochlorperzaine (Compazine).   Sudafed.   Fluconazole (Diflucan).   Ciprofloxacin (Cipro).   Some antidepressants are also included in this group.   Category D - drugs that have clear health risks for the fetus. They include:   Alcohol.   Lithium (used to treat manic depression).   Phenytoin (Dilantin).   Most chemotherapy drugs to treat cancer. In some cases, chemotherapy drugs are given during pregnancy.   Category X - drugs that have been shown to cause birth defects. They should never be taken during pregnancy. These include:   Drugs to treat skin conditions like cystic acne (Accutane) and psoriasis (Tegison or Soriatane).   Thalidomide (a sedative).   Diethylstilbestrol or DES.  No medication is considered 100% safe to take when pregnant because everyone reacts to drugs differently. Aspirin and other drugs containing salicylate are not recommended during pregnancy, especially during the last three months because it can cause bleeding. In rare cases, a woman's caregiver may want her to use these types of drugs under close watch. Acetylsalicylate, a common ingredient in many over-the-counter painkillers, may make a pregnancy last longer. It may cause severe bleeding before and after delivery.  SHOULD I AVOID TAKING ANY MEDICINE WHILE I AM PREGNANT?  Whether or   not you should continue taking medicine during pregnancy is a serious question. However, if you stop taking medicine that you need, this could harm both you and your baby. Talk to your caregiver about if the benefits outweigh the risk for you and your baby.  Pregnant and nursing women who need medication for psychiatric  conditions should consult with their obstetrician, pediatrician and mental health care provider before taking any medication. NATURAL MEDICATIONS OR HERBAL REMEDIES WHEN YOU ARE PREGNANT While some herbal remedies claim they will help with pregnancy, there are no studies to prove these claims are true. Likewise, there are very few studies to look at how safe and effective herbal remedies are during pregnancy. Do not take any herbal products without talking to your caregiver first. These products may contain agents that could harm you and the growing fetus. They could cause problems with your pregnancy.  If you think or know that your mother took diethylstilbesterol (DES), a factory made estrogen, when she was pregnant with you, talk with your caregiver right away. Ask her or him about:  What types of tests you may need.   How often they need to be done.   Anything else you may need to do to make sure you do not develop any problems.  A woman whose mother was given DES when pregnant should be followed and screened for abnormalities of her female organs all through her life. OVER-THE-COUNTER MEDICATIONS THAT ARE SAFE TO TAKE DURING PREGNANCY: Any medication taken during pregnancy should be taken only with the permission of your caregiver.  Allergy (Benadryl).   Cold and Flu, Tylenol (acetaminophen). Tylenol Cold, warm salt water gargles, saline nasal drops.   Constipation (Metamucil, Citrucil, Fiberall/Fobricon, Colace, Milk of Magnesia , Senekot).   Diarrhea (Kaopectate, Immodium, Parepectolin). You should not take these in the first trimester and only take the medication for 24 hours. Call your caregiver if you still have the diarrhea.   Headache (Tylenol).   Ointment for cuts and scrapes (J & J, Bacitracin, Neosporin).   Heartburn (Tums, Riopan, titralacT, Gaviscon).   Hemorrhoids (Preparation H, anusol, tucks, Witch hazel).   Nausea and vomiting (Vitamin B6 100mg  tablets, emetrol if you are not diabetic, Emetrex, sea bands).   Rashes (hydrocortisone cream or ointment, caladryl lotion or cream, benadryl cream or capsules, oatmeal bath).   Yeast infection of the vagina (Monistat cream or tablets, Terazol cream).  FOR MORE INFORMATION For more information regarding the medication you are taking and how it affects your pregnancy, go to [facility] Drug Reference link: http://www.drugs.com/drug_information.html *Some information based on studies from The Food and Drug Administration (FDA). Document Released: 07/02/2005 Document Revised: 03/14/2011 Document Reviewed: 03/16/2009 Exit[employer] Patient Information 2012 [employer]. 

## 2011-07-06 ENCOUNTER — Telehealth: Payer: Self-pay | Admitting: *Deleted

## 2011-07-06 DIAGNOSIS — Z331 Pregnant state, incidental: Secondary | ICD-10-CM

## 2011-07-06 NOTE — Telephone Encounter (Signed)
Pt left message requesting lab results.  I spoke w/Walidah Jerolyn Center and she ordered Shelley Hardy for pt.  I called pt and informed her of lab test result and Shelley Hardy needed.  I made appt for Shelley Hardy on 07/19/11 @ 1030 per pt request. She will call [facility] for new ob visit appt.

## 2011-07-11 ENCOUNTER — Telehealth: Payer: Self-pay | Admitting: *Deleted

## 2011-07-11 ENCOUNTER — Other Ambulatory Visit: Payer: Self-pay

## 2011-07-11 NOTE — Telephone Encounter (Signed)
Pt left message with question about medication. Please call back

## 2011-07-11 NOTE — Telephone Encounter (Signed)
Returned pt call.  She wanted to know if she should continue taking Parlodel. I confirmed with Sid Falcon that she should continue as prescribed and informed the pt. She also asked to change her [place] appt to 07/25/11. I changed appt to 07/1911 @ 0900. Pt voiced understanding of all information given.

## 2011-07-16 ENCOUNTER — Encounter: Payer: Self-pay | Admitting: *Deleted

## 2011-07-16 DIAGNOSIS — E039 Hypothyroidism, unspecified: Secondary | ICD-10-CM | POA: Insufficient documentation

## 2011-07-17 NOTE — L&D Delivery Note (Signed)
Delivery Note At 7:19 AM a viable female was delivered via Vaginal, Spontaneous Delivery (Presentation: Left Occiput Anterior).  APGAR: 8-9, ; weight:  3912 grams .   Placenta status: Intact, Spontaneous.  Cord: 3 vessels with the following complications: None.  Cord pH: none  Anesthesia: None  Episiotomy: None Lacerations: 1st degree;Periurethral Suture Repair: 3.0 vicryl rapide Est. Blood Loss (mL): 350  Mom to postpartum.  Baby to nursery-stable.  HARPER,CHARLES A 03/07/2012, 7:57 AM

## 2011-07-19 ENCOUNTER — Ambulatory Visit (HOSPITAL_COMMUNITY): Payer: Medicaid Other

## 2011-07-25 ENCOUNTER — Ambulatory Visit (HOSPITAL_COMMUNITY)
Admission: RE | Admit: 2011-07-25 | Discharge: 2011-07-25 | Disposition: A | Discharge status: Home / Self Care | Payer: Medicaid Other | Source: Ambulatory Visit | Attending: Family | Admitting: Family

## 2011-07-25 ENCOUNTER — Ambulatory Visit (INDEPENDENT_AMBULATORY_CARE_PROVIDER_SITE_OTHER): Payer: Medicaid Other | Admitting: Family Medicine

## 2011-07-25 DIAGNOSIS — Z3689 Encounter for other specified antenatal screening: Secondary | ICD-10-CM | POA: Insufficient documentation

## 2011-07-25 DIAGNOSIS — O3680X0 Pregnancy with inconclusive fetal viability, not applicable or unspecified: Secondary | ICD-10-CM

## 2011-07-25 DIAGNOSIS — Z331 Pregnant state, incidental: Secondary | ICD-10-CM

## 2011-08-15 ENCOUNTER — Other Ambulatory Visit: Payer: Self-pay | Admitting: Obstetrics

## 2011-08-15 DIAGNOSIS — Z3682 Encounter for antenatal screening for nuchal translucency: Secondary | ICD-10-CM

## 2011-08-15 DIAGNOSIS — O09529 Supervision of elderly multigravida, unspecified trimester: Secondary | ICD-10-CM

## 2011-08-15 DIAGNOSIS — O358XX0 Maternal care for other (suspected) fetal abnormality and damage, not applicable or unspecified: Secondary | ICD-10-CM

## 2011-08-21 ENCOUNTER — Ambulatory Visit (HOSPITAL_COMMUNITY): Payer: Medicaid Other

## 2011-08-23 ENCOUNTER — Other Ambulatory Visit (HOSPITAL_COMMUNITY): Payer: Medicaid Other

## 2011-08-23 ENCOUNTER — Encounter (HOSPITAL_COMMUNITY): Payer: Medicaid Other

## 2011-08-28 ENCOUNTER — Ambulatory Visit (HOSPITAL_COMMUNITY)
Admission: RE | Admit: 2011-08-28 | Discharge: 2011-08-28 | Disposition: A | Discharge status: Home / Self Care | Payer: Medicaid Other | Source: Ambulatory Visit | Attending: Obstetrics | Admitting: Obstetrics

## 2011-08-28 ENCOUNTER — Encounter (HOSPITAL_COMMUNITY): Payer: Self-pay

## 2011-08-28 ENCOUNTER — Other Ambulatory Visit: Payer: Self-pay

## 2011-08-28 ENCOUNTER — Other Ambulatory Visit (HOSPITAL_COMMUNITY): Payer: Medicaid Other

## 2011-08-28 DIAGNOSIS — O09529 Supervision of elderly multigravida, unspecified trimester: Secondary | ICD-10-CM | POA: Insufficient documentation

## 2011-08-28 DIAGNOSIS — O358XX0 Maternal care for other (suspected) fetal abnormality and damage, not applicable or unspecified: Secondary | ICD-10-CM

## 2011-08-28 DIAGNOSIS — IMO0002 Reserved for concepts with insufficient information to code with codable children: Secondary | ICD-10-CM | POA: Insufficient documentation

## 2011-08-28 DIAGNOSIS — Z3682 Encounter for antenatal screening for nuchal translucency: Secondary | ICD-10-CM

## 2011-08-28 NOTE — Progress Notes (Signed)
Genetic Counseling  High-Risk Gestation Note  Appointment Date:  08/28/2011 Referred By: Coral Ceo MD Date of Birth:  May 16, 1976   Pregnancy History: [id number] Estimated Date of Delivery: 03/02/12 Estimated Gestational Age: [redacted]w[redacted]d Attending: Particia Nearing, MD   [name] was seen for genetic counseling because of a maternal age of 36 y.o..     She was counseled regarding maternal age and the association with risk for chromosome conditions due to nondisjunction with aging of the ova.   We reviewed chromosomes, nondisjunction, and the associated 1 in 26 risk for fetal aneuploidy at [redacted]w[redacted]d gestation related to a maternal age of 36 y.o. at delivery.  She was counseled that the risk for aneuploidy decreases as gestational age increases, accounting for those pregnancies which spontaneously abort.  We specifically discussed Down syndrome (trisomy 74), trisomies 53 and 32, and sex chromosome aneuploidies (47,XXX and 47,XXY) including the common features and prognoses of each.   We reviewed available screening and diagnostic options.  Regarding screening tests, we discussed the options of First screen, Quad screen and ultrasound.  She understands that screening tests are used to modify a patient's a priori risk for aneuploidy, typically based on age.  This estimate provides a pregnancy specific risk assessment. We discussed another type of screening test, noninvasive prenatal testing (NIPT), which utilizes cell free fetal DNA found in the maternal circulation. This test is not diagnostic for chromosome conditions, but can provide information regarding the presence or absence of extra fetal DNA for chromosomes 13, 18 and 21. Thus, it would not identify or rule out all fetal aneuploidy. The reported detection rate is greater than 99% for Trisomy 21, greater than 97% for Trisomy 18, and is approximately 80% (8 out of 10) for Trisomy 13. The false positive rate is thought to be less than 0.1% for  any of these conditions.  We also reviewed the availability of diagnostic options including CVS and amniocentesis.  We discussed the risks, limitations, and benefits of each.  She understands that these tests cannot diagnose or rule out all birth defects or genetic conditions.   After reviewing these options, [name] elected to have cell free fetal DNA testing ([facility]), but declined First trimester screening, CVS, and amniocentesis.  Second trimester targeted ultrasound is also available to the patient. She understands that ultrasound cannot rule out all birth defects or genetic syndromes. The patient was advised of this limitation and states she still does not want diagnostic testing at this time or in the future given the associated risk of complications.  However, she was counseled that 50-80% of fetuses with Down syndrome and up to 90% of fetuses with trisomies 13 and 18, when well visualized, have detectable anomalies or soft markers by ultrasound. Complete ultrasound was performed at the time of today's visit. Visualized fetal anatomy appeared normal. Complete ultrasound results reported separately.   In addition we discussed sickle cell anemia (SCA) including the carrier frequency and incidence in the African-American population, the availability of carrier testing and prenatal diagnosis if indicated.  In addition, we discussed that hemoglobinopathies are routinely screened for as part of the [place] newborn screening panel.  Hemoglobin electrophoresis was not performed today. This testing is available to the patient, if desired, and if not previously performed.   [name] was provided with written information regarding cystic fibrosis (CF) including the carrier frequency and incidence in the African population, the availability of carrier testing and prenatal diagnosis if indicated.  In addition,  we discussed that CF is routinely screened for as part of the [place] newborn screening panel.   She declined testing today.   Both family histories were reviewed and found to be contributory for a form of anemia in the patient's paternal first cousin once removed. This relative is currently 22 months old, was diagnosed at approximately 44 months of age, and has had a blood transfusion. Her parents are first cousins to each other (and also each paternal first cousins to [name]). The patient stated that there is a specific diagnosis but she could not remember the name at the time of today's visit. She reported that it is an underlying genetic condition and was caused by the parents being related.   We discussed that this could be an inherited hemoglobinopathy such as thalassemia or sickle cell disease, but it could also be a complete separate condition. We discussed that consanguineous unions are associated with an increased risk for autosomal recessive and multifactorial conditions in offspring. We specifically discussed genes and autosomal recessive inheritance, where carrier parents would have a 1 in 4 risk with each pregnancy to have an affected child. In the case of an autosomal recessive genetic condition in this relative, [name] would have a 1 in 8 risk also be a carrier.  [name]'s partner would have the general population risk to be a carrier, which varies with condition, given no reported family history and consanguinity was denied. We discussed that carrier testing may be available for her, depending on the specific condition. Thus, the specific diagnosis in this relative would first need to be known. In the case of a condition that follows a different pattern of inheritance, recurrence risk estimate would change. [name] planned to talk with her sister to find more information regarding this cousin's diagnosis. She may contact us once more information is obtained in order to more accurately assess recurrence risk and available testing options. Without further information  regarding the provided family history, an accurate genetic risk cannot be calculated. Further genetic counseling is warranted if more information is obtained.  [name] denied exposure to environmental toxins or chemical agents. She denied the use of alcohol, tobacco or street drugs. She denied significant viral illnesses during the course of her pregnancy. Her medical and surgical histories were contributory for two previous first trimester SABs.   I counseled [name] regarding the above risks and available options.  The approximate face-to-face time with the genetic counselor was 30 minutes.  Quinn Plowman, MS,  Certified Genetic Counselor 08/28/2011

## 2011-09-07 ENCOUNTER — Telehealth (HOSPITAL_COMMUNITY): Payer: Self-pay | Admitting: MS"

## 2011-09-07 NOTE — Telephone Encounter (Signed)
Left message for patient to return call regarding good news.

## 2011-09-10 NOTE — Telephone Encounter (Signed)
Called Shelley Hardy to discuss her [facility], cell free fetal DNA testing.  We reviewed that these are within normal limits, showing a less than 1 in 10,000 risk for trisomies 21, 18 and 13.  We reviewed that this testing identifies > 99% of pregnancies with trisomy 21, >97% of pregnancies with trisomy 44, and >80% with trisomy 7; the false positive rate is <0.1% for all conditions.  She understands that this testing does not identify all genetic conditions.  All questions were answered to her satisfaction, she was encouraged to call with additional questions or concerns.  [name] asked about testing for the condition that her cousin has, which we discussed during her genetic counseling visit. She stated that she was unsure of how to say the condition. I asked if she was able to spell it so that maybe I could determine the condition that way. The patient stated that she did not know how to spell the name of the condition and asked if she could call back once she obtained more information. Encouraged patient to call back if she obtains more information about the condition in the family.   Quinn Plowman, MS Patent attorney

## 2011-12-13 ENCOUNTER — Telehealth: Payer: Self-pay | Admitting: *Deleted

## 2011-12-13 ENCOUNTER — Other Ambulatory Visit: Payer: Self-pay | Admitting: Obstetrics & Gynecology

## 2011-12-13 MED ORDER — PRENATAL VIT-FE FUMARATE-FA 15-1 MG PO TABS: 1.0000 | ORAL_TABLET | Freq: Every day | ORAL | Status: DC

## 2011-12-13 NOTE — Telephone Encounter (Signed)
Called pharmacy and they verified original prescription May 2012 written by Dr. Okey Dupre and patient requesting refill

## 2011-12-13 NOTE — Telephone Encounter (Signed)
Received a fax from pharmacy requesting a refill for Se-natal 19 tablets, take one daily, 90 tabs, last refill 10/30/11-  I do not see that we are following her this pregnancy- has a pending appt for 03/03/12 with  Antionette Char.

## 2011-12-17 NOTE — Progress Notes (Signed)
Called patient number , unable to leave message, called friend # with [employer] interpreter [phone] and left message with a female friend to have pt. Call clinic re: some information

## 2011-12-18 NOTE — Progress Notes (Signed)
Called patients number left a voicemail to call us back.

## 2011-12-19 NOTE — Progress Notes (Signed)
Called pt and left message via [facility] [employer] # [phone] with pt's sister to have pt to please give [place] a return call back.

## 2011-12-20 NOTE — Progress Notes (Addendum)
Patient not returning calls, if she calls back in future we will let her know that she should get future refills from Dr. Tamela Oddi. Dr Penne Lash gave her a year of refills. Pharmacy informed that future refills should be sent to Dr. Tamela Oddi.

## 2012-01-30 LAB — OB RESULTS CONSOLE GBS: GBS: POSITIVE

## 2012-03-03 ENCOUNTER — Encounter (HOSPITAL_COMMUNITY): Payer: Self-pay | Admitting: *Deleted

## 2012-03-03 ENCOUNTER — Telehealth (HOSPITAL_COMMUNITY): Payer: Self-pay | Admitting: *Deleted

## 2012-03-03 ENCOUNTER — Other Ambulatory Visit: Payer: Self-pay | Admitting: Obstetrics & Gynecology

## 2012-03-03 DIAGNOSIS — O480 Post-term pregnancy: Secondary | ICD-10-CM

## 2012-03-03 NOTE — Telephone Encounter (Signed)
Preadmission screen  Interpreter number [phone]

## 2012-03-06 ENCOUNTER — Inpatient Hospital Stay (HOSPITAL_COMMUNITY)
Admission: AD | Admit: 2012-03-06 | Discharge: 2012-03-09 | DRG: 775 | Disposition: A | Discharge status: Home / Self Care | Payer: Medicaid Other | Source: Ambulatory Visit | Attending: Obstetrics & Gynecology | Admitting: Obstetrics & Gynecology

## 2012-03-06 DIAGNOSIS — O09529 Supervision of elderly multigravida, unspecified trimester: Secondary | ICD-10-CM | POA: Diagnosis present

## 2012-03-06 DIAGNOSIS — D689 Coagulation defect, unspecified: Secondary | ICD-10-CM | POA: Diagnosis present

## 2012-03-06 DIAGNOSIS — D696 Thrombocytopenia, unspecified: Secondary | ICD-10-CM | POA: Diagnosis present

## 2012-03-06 DIAGNOSIS — Z22330 Carrier of Group B streptococcus: Secondary | ICD-10-CM

## 2012-03-06 DIAGNOSIS — O99892 Other specified diseases and conditions complicating childbirth: Secondary | ICD-10-CM | POA: Diagnosis present

## 2012-03-06 DIAGNOSIS — N942 Vaginismus: Secondary | ICD-10-CM

## 2012-03-06 DIAGNOSIS — E039 Hypothyroidism, unspecified: Secondary | ICD-10-CM

## 2012-03-07 ENCOUNTER — Encounter (HOSPITAL_COMMUNITY): Payer: Self-pay | Admitting: *Deleted

## 2012-03-07 ENCOUNTER — Ambulatory Visit (HOSPITAL_COMMUNITY): Admission: RE | Admit: 2012-03-07 | Payer: Medicaid Other | Source: Ambulatory Visit

## 2012-03-07 DIAGNOSIS — O99892 Other specified diseases and conditions complicating childbirth: Secondary | ICD-10-CM

## 2012-03-07 DIAGNOSIS — O09529 Supervision of elderly multigravida, unspecified trimester: Secondary | ICD-10-CM

## 2012-03-07 DIAGNOSIS — D689 Coagulation defect, unspecified: Secondary | ICD-10-CM

## 2012-03-07 LAB — COMPREHENSIVE METABOLIC PANEL
AST: 16 U/L (ref 0–37)
BUN: 6 mg/dL (ref 6–23)
CO2: 21 mEq/L (ref 19–32)
Calcium: 9.3 mg/dL (ref 8.4–10.5)
Chloride: 101 mEq/L (ref 96–112)
Creatinine, Ser: 0.39 mg/dL — ABNORMAL LOW (ref 0.50–1.10)
GFR calc Af Amer: >90 mL/min (ref >90)
GFR calc non Af Amer: >90 mL/min (ref >90)
Glucose, Bld: 96 mg/dL (ref 70–99)
Total Bilirubin: 0.3 mg/dL (ref 0.3–1.2)

## 2012-03-07 LAB — CBC
HCT: 33.7 % — ABNORMAL LOW (ref 36.0–46.0)
HCT: 32.9 % — ABNORMAL LOW (ref 36.0–46.0)
Hemoglobin: 11.2 g/dL — ABNORMAL LOW (ref 12.0–15.0)
MCH: 30.2 pg (ref 26.0–34.0)
MCV: 91.1 fL (ref 78.0–100.0)
MCV: 91.1 fL (ref 78.0–100.0)
Platelets: 87 10*3/uL — ABNORMAL LOW (ref 150–400)
RBC: 3.7 MIL/uL — ABNORMAL LOW (ref 3.87–5.11)
WBC: 6.6 10*3/uL (ref 4.0–10.5)

## 2012-03-07 LAB — LACTATE DEHYDROGENASE: LDH: 160 U/L (ref 94–250)

## 2012-03-07 LAB — TYPE AND SCREEN: Antibody Screen: NEGATIVE

## 2012-03-07 LAB — URIC ACID: Uric Acid, Serum: 4.2 mg/dL (ref 2.4–7.0)

## 2012-03-07 MED ORDER — NALOXONE HCL 0.4 MG/ML IJ SOLN
INTRAMUSCULAR | Status: AC
  Filled 2012-03-07: qty 1

## 2012-03-07 MED ORDER — ONDANSETRON HCL 4 MG PO TABS: 4.0000 mg | ORAL_TABLET | ORAL | Status: DC | PRN

## 2012-03-07 MED ORDER — OXYCODONE-ACETAMINOPHEN 5-325 MG PO TABS: 1.0000 | ORAL_TABLET | ORAL | Status: DC | PRN

## 2012-03-07 MED ORDER — LIDOCAINE HCL (PF) 1 % IJ SOLN
30.0000 mL | INTRAMUSCULAR | Status: AC | PRN
  Administered 2012-03-07: 30 mL via SUBCUTANEOUS
  Filled 2012-03-07: qty 30

## 2012-03-07 MED ORDER — PHENYLEPHRINE 40 MCG/ML (10ML) SYRINGE FOR IV PUSH (FOR BLOOD PRESSURE SUPPORT)
80.0000 ug | PREFILLED_SYRINGE | INTRAVENOUS | Status: DC | PRN
  Filled 2012-03-07: qty 5

## 2012-03-07 MED ORDER — TETANUS-DIPHTH-ACELL PERTUSSIS 5-2.5-18.5 LF-MCG/0.5 IM SUSP
0.5000 mL | Freq: Once | INTRAMUSCULAR | Status: AC
  Administered 2012-03-08: 0.5 mL via INTRAMUSCULAR
  Filled 2012-03-07: qty 0.5

## 2012-03-07 MED ORDER — LACTATED RINGERS IV SOLN
500.0000 mL | INTRAVENOUS | Status: DC | PRN
Start: 2012-03-07 — End: 2012-03-07

## 2012-03-07 MED ORDER — EPHEDRINE 5 MG/ML INJ
10.0000 mg | INTRAVENOUS | Status: DC | PRN
  Filled 2012-03-07: qty 4

## 2012-03-07 MED ORDER — LANOLIN HYDROUS EX OINT: TOPICAL_OINTMENT | CUTANEOUS | Status: DC | PRN

## 2012-03-07 MED ORDER — IBUPROFEN 600 MG PO TABS
600.0000 mg | ORAL_TABLET | Freq: Four times a day (QID) | ORAL | Status: DC | PRN
  Administered 2012-03-07: 600 mg via ORAL
  Filled 2012-03-07: qty 1

## 2012-03-07 MED ORDER — OXYTOCIN 40 UNITS IN LACTATED RINGERS INFUSION - SIMPLE MED: 62.5000 mL/h | Freq: Once | INTRAVENOUS | Status: DC

## 2012-03-07 MED ORDER — ONDANSETRON HCL 4 MG/2ML IJ SOLN: 4.0000 mg | Freq: Four times a day (QID) | INTRAMUSCULAR | Status: DC | PRN

## 2012-03-07 MED ORDER — DIBUCAINE 1 % RE OINT: 1.0000 "application " | TOPICAL_OINTMENT | RECTAL | Status: DC | PRN

## 2012-03-07 MED ORDER — BUTORPHANOL TARTRATE 1 MG/ML IJ SOLN
1.0000 mg | INTRAMUSCULAR | Status: DC | PRN
  Administered 2012-03-07 (×2): 1 mg via INTRAVENOUS
  Filled 2012-03-07 (×2): qty 1

## 2012-03-07 MED ORDER — TERBUTALINE SULFATE 1 MG/ML IJ SOLN: 0.2500 mg | Freq: Once | INTRAMUSCULAR | Status: DC | PRN

## 2012-03-07 MED ORDER — OXYTOCIN 40 UNITS IN LACTATED RINGERS INFUSION - SIMPLE MED
1.0000 m[IU]/min | INTRAVENOUS | Status: DC
  Administered 2012-03-07: 1 m[IU]/min via INTRAVENOUS
  Filled 2012-03-07: qty 1000

## 2012-03-07 MED ORDER — ACETAMINOPHEN 325 MG PO TABS: 650.0000 mg | ORAL_TABLET | ORAL | Status: DC | PRN

## 2012-03-07 MED ORDER — IBUPROFEN 600 MG PO TABS
600.0000 mg | ORAL_TABLET | Freq: Four times a day (QID) | ORAL | Status: DC
  Administered 2012-03-07 – 2012-03-09 (×9): 600 mg via ORAL
  Filled 2012-03-07 (×9): qty 1

## 2012-03-07 MED ORDER — BENZOCAINE-MENTHOL 20-0.5 % EX AERO
1.0000 "application " | INHALATION_SPRAY | CUTANEOUS | Status: DC | PRN
  Administered 2012-03-07: 1 via TOPICAL
  Filled 2012-03-07: qty 56

## 2012-03-07 MED ORDER — LACTATED RINGERS IV SOLN: 500.0000 mL | Freq: Once | INTRAVENOUS | Status: DC

## 2012-03-07 MED ORDER — FLEET ENEMA 7-19 GM/118ML RE ENEM: 1.0000 | ENEMA | RECTAL | Status: DC | PRN

## 2012-03-07 MED ORDER — SIMETHICONE 80 MG PO CHEW: 80.0000 mg | CHEWABLE_TABLET | ORAL | Status: DC | PRN

## 2012-03-07 MED ORDER — ZOLPIDEM TARTRATE 5 MG PO TABS: 5.0000 mg | ORAL_TABLET | Freq: Every evening | ORAL | Status: DC | PRN

## 2012-03-07 MED ORDER — LACTATED RINGERS IV SOLN: INTRAVENOUS | Status: DC

## 2012-03-07 MED ORDER — CITRIC ACID-SODIUM CITRATE 334-500 MG/5ML PO SOLN: 30.0000 mL | ORAL | Status: DC | PRN

## 2012-03-07 MED ORDER — EPHEDRINE 5 MG/ML INJ: 10.0000 mg | INTRAVENOUS | Status: DC | PRN

## 2012-03-07 MED ORDER — PRENATAL MULTIVITAMIN CH
1.0000 | ORAL_TABLET | Freq: Every day | ORAL | Status: DC
  Administered 2012-03-08 – 2012-03-09 (×2): 1 via ORAL
  Filled 2012-03-07 (×2): qty 1

## 2012-03-07 MED ORDER — DEXTROSE 5 % IV SOLN
5.0000 10*6.[IU] | Freq: Once | INTRAVENOUS | Status: AC
  Administered 2012-03-07: 5 10*6.[IU] via INTRAVENOUS
  Filled 2012-03-07: qty 5

## 2012-03-07 MED ORDER — ONDANSETRON HCL 4 MG/2ML IJ SOLN: 4.0000 mg | INTRAMUSCULAR | Status: DC | PRN

## 2012-03-07 MED ORDER — PHENYLEPHRINE 40 MCG/ML (10ML) SYRINGE FOR IV PUSH (FOR BLOOD PRESSURE SUPPORT): 80.0000 ug | PREFILLED_SYRINGE | INTRAVENOUS | Status: DC | PRN

## 2012-03-07 MED ORDER — SENNOSIDES-DOCUSATE SODIUM 8.6-50 MG PO TABS
2.0000 | ORAL_TABLET | Freq: Every day | ORAL | Status: DC
  Administered 2012-03-07 – 2012-03-08 (×2): 2 via ORAL

## 2012-03-07 MED ORDER — FENTANYL 2.5 MCG/ML BUPIVACAINE 1/10 % EPIDURAL INFUSION (WH - ANES)
14.0000 mL/h | INTRAMUSCULAR | Status: DC
  Filled 2012-03-07: qty 60

## 2012-03-07 MED ORDER — DIPHENHYDRAMINE HCL 50 MG/ML IJ SOLN: 12.5000 mg | INTRAMUSCULAR | Status: DC | PRN

## 2012-03-07 MED ORDER — OXYTOCIN BOLUS FROM INFUSION
250.0000 mL | Freq: Once | INTRAVENOUS | Status: DC
  Filled 2012-03-07: qty 500

## 2012-03-07 MED ORDER — DIPHENHYDRAMINE HCL 25 MG PO CAPS: 25.0000 mg | ORAL_CAPSULE | Freq: Four times a day (QID) | ORAL | Status: DC | PRN

## 2012-03-07 MED ORDER — PROMETHAZINE HCL 25 MG/ML IJ SOLN
12.5000 mg | Freq: Four times a day (QID) | INTRAMUSCULAR | Status: DC | PRN
  Administered 2012-03-07: 12.5 mg via INTRAVENOUS
  Filled 2012-03-07: qty 1

## 2012-03-07 MED ORDER — PENICILLIN G POTASSIUM 5000000 UNITS IJ SOLR
2.5000 10*6.[IU] | INTRAVENOUS | Status: DC
  Filled 2012-03-07 (×3): qty 2.5

## 2012-03-07 MED ORDER — WITCH HAZEL-GLYCERIN EX PADS: 1.0000 "application " | MEDICATED_PAD | CUTANEOUS | Status: DC | PRN

## 2012-03-07 NOTE — ED Provider Notes (Signed)
Asked to do speculum exam to rule out ROM  There was pooling of watery red fluid in vault.  + Ferning  Briant Cedar RN will call Dr Clearance Coots

## 2012-03-07 NOTE — Progress Notes (Signed)
UR Chart review completed.  

## 2012-03-07 NOTE — MAU Note (Signed)
PT SAYS   HAS BEEN HAVING UC  X3 DAYS.  SHE LAYED DOWN TO SLEEP- THEN AT 2350 SHE FELT POP  - STOOD UP AND BLOOD RAN DOWN HER LEGS- SHE THINKS SROM.Marland Kitchen  ON ARRIVAL-  HAS BLOOD TINGED FLUID.

## 2012-03-07 NOTE — H&P (Signed)
Shelley Hardy is a 36 y.o. female presenting for SROM. Maternal Medical History:  Reason for admission: Reason for admission: rupture of membranes.  36 yo G5 P2  EDC 03-02-12.  Presents with SROM and irregular UC's.  Contractions: Frequency: irregular.    Fetal activity: Perceived fetal activity is normal.   Last perceived fetal movement was within the past hour.    Prenatal complications: no prenatal complications Prenatal Complications - Diabetes: none.    OB History    Grav Para Term Preterm Abortions TAB SAB Ect Mult Living   5 2 2  0 2 0 2 0 0 2     Past Medical History  Diagnosis Date  . Pituitary tumor   . AMA (advanced maternal age) multigravida 35+   . Depression    History reviewed. No pertinent past surgical history. Family History: family history includes Anemia in her cousin. Social History:  reports that she has never smoked. She has never used smokeless tobacco. She reports that she does not drink alcohol or use illicit drugs.   Prenatal Transfer Tool  Maternal Diabetes: No Genetic Screening: Declined Maternal Ultrasounds/Referrals: Normal Fetal Ultrasounds or other Referrals:  None Maternal Substance Abuse:  No Significant Maternal Medications:  None Significant Maternal Lab Results:  Lab values include: Group B Strep positive Other Comments:  None  Review of Systems  All other systems reviewed and are negative.    Dilation: 4 Effacement (%): 70 Station: -1 Exam by:: H.Norton, RN  Blood pressure 123/76, pulse 100, temperature 97.8 F (36.6 C), temperature source Oral, resp. rate 18, height 5\' 6"  (1.676 m), weight 188 lb (85.276 kg), last menstrual period 05/27/2011, unknown if currently breastfeeding. Maternal Exam:  Abdomen: Patient reports no abdominal tenderness. Fetal presentation: vertex  Pelvis: adequate for delivery.   Cervix: Cervix evaluated by digital exam.     Physical Exam  Constitutional: She is oriented to person, place,  and time. She appears well-developed and well-nourished.  HENT:  Head: Normocephalic and atraumatic.  Eyes: Conjunctivae are normal. Pupils are equal, round, and reactive to light.  Neck: Normal range of motion. Neck supple.  Cardiovascular: Normal rate and regular rhythm.   Respiratory: Effort normal.  GI: Soft.  Musculoskeletal: Normal range of motion.  Neurological: She is alert and oriented to person, place, and time.  Skin: Skin is warm and dry.  Psychiatric: She has a normal mood and affect. Her behavior is normal. Judgment and thought content normal.    Prenatal labs: ABO, Rh: --/--/A POS (08/23 0030) Antibody: NEG (08/23 0030) Rubella:   RPR: Nonreactive (01/29 0000)  HBsAg:    HIV: Non-reactive (01/29 0000)  GBS: Positive (07/17 0000)   Assessment/Plan: 40 weeks.  SROM.  Early labor, on pitocin augmentation.   Alrick Cubbage A 03/07/2012, 4:57 AM

## 2012-03-07 NOTE — Progress Notes (Signed)
Daneen Volcy Nyland is a 36 y.o. [id number] at [redacted]w[redacted]d by LMP admitted for rupture of membranes  Subjective:   Objective: BP 123/76  Pulse 100  Temp 97.8 F (36.6 C) (Oral)  Resp 18  Ht 5\' 6"  (1.676 m)  Wt 188 lb (85.276 kg)  BMI 30.34 kg/m2  LMP 05/27/2011  Breastfeeding? Unknown      FHT:  FHR: 150 bpm, variability: moderate,  accelerations:  Present,  decelerations:  Absent UC:   regular, every 2-4 minutes SVE:   Dilation: 4 Effacement (%): 70 Station: -1 Exam by:: H.Norton, RN   Labs: Lab Results  Component Value Date   WBC 6.6 03/07/2012   HGB 11.2* 03/07/2012   HCT 33.7* 03/07/2012   MCV 91.1 03/07/2012   PLT 87* 03/07/2012    Assessment / Plan: Augmentation of labor, progressing well  Labor: Progressing normally Preeclampsia:  n/a Fetal Wellbeing:  Category I Pain Control:  Stadol I/D:  n/a Anticipated MOD:  NSVD  Demia Viera A 03/07/2012, 5:08 AM

## 2012-03-07 NOTE — Progress Notes (Signed)
Shelley Hardy is Hardy 36 y.o. [id number] at [redacted]w[redacted]d by LMP admitted for rupture of membranes  Subjective:   Objective: BP 120/70  Pulse 101  Temp 97.8 F (36.6 C) (Oral)  Resp 20  Ht 5\' 6"  (1.676 m)  Wt 188 lb (85.276 kg)  BMI 30.34 kg/m2  LMP 05/27/2011  Breastfeeding? Unknown      FHT:  FHR: 150 bpm, variability: moderate,  accelerations:  Present,  decelerations:  Absent UC:   regular, every 2-4 minutes SVE:   Dilation: 6 Effacement (%): 90 Station: 0 Exam by:: H.Norton, RN   Labs: Lab Results  Component Value Date   WBC 6.6 03/07/2012   HGB 11.2* 03/07/2012   HCT 33.7* 03/07/2012   MCV 91.1 03/07/2012   PLT 87* 03/07/2012    Assessment / Plan: Augmentation of labor, progressing well.  Thrombocytopenia.  ? Gestational Thrombocytopenia.  Will get preeclampsia labs.  Labor: Progressing normally Preeclampsia:  n/Hardy Fetal Wellbeing:  Category I Pain Control:  Stadol I/D:  n/Hardy Anticipated MOD:  NSVD  Shelley Hardy 03/07/2012, 6:32 AM

## 2012-03-08 LAB — CBC
HCT: 32.3 % — ABNORMAL LOW (ref 36.0–46.0)
Hemoglobin: 10.5 g/dL — ABNORMAL LOW (ref 12.0–15.0)
MCH: 30.1 pg (ref 26.0–34.0)
MCHC: 32.5 g/dL (ref 30.0–36.0)

## 2012-03-08 NOTE — Progress Notes (Signed)
Patient ID: Shelley Hardy, female   DOB: 1975-08-22, 36 y.o.   MRN: [id number] Postpartum day one Vital signs normal Fundus firm Lochia moderate No complaints

## 2012-03-08 NOTE — Progress Notes (Signed)
Spoke with MOB briefly and spoke with RN.  NO current depression concerns.  MOB reports hx from years ago.   Patient was referred for history of depression/anxiety. * Referral screened out by Clinical Social Worker because none of the following criteria appear to apply: ~ History of anxiety/depression during this pregnancy, or of post-partum depression. ~ Diagnosis of anxiety and/or depression within last 3 years ~ History of depression due to pregnancy loss/loss of child OR * Patient's symptoms currently being treated with medication and/or therapy. Please contact the Clinical Social Worker if needs arise, or by the patient's request.  

## 2012-03-09 NOTE — Discharge Summary (Signed)
Obstetric Discharge Summary Reason for Admission: onset of labor Prenatal Procedures: none Intrapartum Procedures: spontaneous vaginal delivery Postpartum Procedures: none Complications-Operative and Postpartum: none Hemoglobin  Date Value Range Status  03/08/2012 10.5* 12.0 - 15.0 g/dL Final     HCT  Date Value Range Status  03/08/2012 32.3* 36.0 - 46.0 % Final    Physical Exam:  General: alert Lochia: appropriate Uterine Fundus: firm Incision: healing well DVT Evaluation: No evidence of DVT seen on physical exam.  Discharge Diagnoses: Term Pregnancy-delivered  Discharge Information: Date: 03/09/2012 Activity: pelvic rest Diet: routine Medications: Percocet Condition: stable Instructions: refer to practice specific booklet Discharge to: home Follow-up Information    Follow up with HARPER,CHARLES A, MD. Call in 6 weeks.   Contact information:   [address] [place] [address] [phone]          Newborn Data: Live born female  Birth Weight: 8 lb 10 oz (3912 g) APGAR: 7, 9  Home with mother.  Shelley Hardy 03/09/2012, 6:46 AM

## 2012-03-12 ENCOUNTER — Inpatient Hospital Stay (HOSPITAL_COMMUNITY): Admission: RE | Admit: 2012-03-12 | Payer: Medicaid Other | Source: Ambulatory Visit

## 2012-03-20 ENCOUNTER — Encounter (HOSPITAL_COMMUNITY): Payer: Self-pay | Admitting: *Deleted

## 2012-08-19 NOTE — Progress Notes (Signed)
  Subjective:    Patient ID: Shelley Hardy, female    DOB: [birth year], 37 y.o.   MRN: [id number]  HPI  Here for f/u results.  U/s shows IUP, viable.  Review of Systems     Objective:   Physical Exam  Deferred      Assessment & Plan:  Begin prenatal care.

## 2013-05-13 ENCOUNTER — Other Ambulatory Visit: Payer: Self-pay | Admitting: Obstetrics & Gynecology

## 2013-05-21 ENCOUNTER — Other Ambulatory Visit: Payer: Self-pay

## 2013-07-17 ENCOUNTER — Other Ambulatory Visit: Payer: Self-pay | Admitting: Obstetrics & Gynecology

## 2013-07-21 ENCOUNTER — Other Ambulatory Visit: Payer: Self-pay | Admitting: *Deleted

## 2013-08-07 ENCOUNTER — Ambulatory Visit (INDEPENDENT_AMBULATORY_CARE_PROVIDER_SITE_OTHER): Payer: Medicaid Other | Admitting: Advanced Practice Midwife

## 2013-08-07 ENCOUNTER — Encounter: Payer: Self-pay | Admitting: Advanced Practice Midwife

## 2013-08-07 VITALS — BP 107/70 | HR 81 | Temp 97.8°F | Ht 66.0 in | Wt 177.5 lb

## 2013-08-07 DIAGNOSIS — Z0000 Encounter for general adult medical examination without abnormal findings: Secondary | ICD-10-CM

## 2013-08-07 DIAGNOSIS — E049 Nontoxic goiter, unspecified: Secondary | ICD-10-CM

## 2013-08-07 DIAGNOSIS — D352 Benign neoplasm of pituitary gland: Secondary | ICD-10-CM | POA: Insufficient documentation

## 2013-08-07 DIAGNOSIS — Z01419 Encounter for gynecological examination (general) (routine) without abnormal findings: Secondary | ICD-10-CM

## 2013-08-07 DIAGNOSIS — E039 Hypothyroidism, unspecified: Secondary | ICD-10-CM

## 2013-08-07 DIAGNOSIS — IMO0001 Reserved for inherently not codable concepts without codable children: Secondary | ICD-10-CM

## 2013-08-07 DIAGNOSIS — Z3202 Encounter for pregnancy test, result negative: Secondary | ICD-10-CM

## 2013-08-07 LAB — CBC WITH DIFFERENTIAL/PLATELET
BASOS ABS: 0 10*3/uL (ref 0.0–0.1)
Basophils Relative: 0 % (ref 0–1)
EOS PCT: 2 % (ref 0–5)
Eosinophils Absolute: 0.1 10*3/uL (ref 0.0–0.7)
HCT: 36.8 % (ref 36.0–46.0)
Hemoglobin: 12.4 g/dL (ref 12.0–15.0)
LYMPHS PCT: 47 % — AB (ref 12–46)
Lymphs Abs: 2.6 10*3/uL (ref 0.7–4.0)
MCH: 29.2 pg (ref 26.0–34.0)
MCHC: 33.7 g/dL (ref 30.0–36.0)
MCV: 86.6 fL (ref 78.0–100.0)
Monocytes Absolute: 0.3 10*3/uL (ref 0.1–1.0)
Monocytes Relative: 6 % (ref 3–12)
NEUTROS ABS: 2.4 10*3/uL (ref 1.7–7.7)
NEUTROS PCT: 45 % (ref 43–77)
PLATELETS: 250 10*3/uL (ref 150–400)
RBC: 4.25 MIL/uL (ref 3.87–5.11)
RDW: 13.4 % (ref 11.5–15.5)
WBC: 5.4 10*3/uL (ref 4.0–10.5)

## 2013-08-07 LAB — COMPREHENSIVE METABOLIC PANEL
ALT: 13 U/L (ref 0–35)
AST: 12 U/L (ref 0–37)
Albumin: 4.4 g/dL (ref 3.5–5.2)
Alkaline Phosphatase: 57 U/L (ref 39–117)
BUN: 12 mg/dL (ref 6–23)
CALCIUM: 9.5 mg/dL (ref 8.4–10.5)
CHLORIDE: 103 meq/L (ref 96–112)
CO2: 27 mEq/L (ref 19–32)
CREATININE: 0.5 mg/dL (ref 0.50–1.10)
Glucose, Bld: 103 mg/dL — ABNORMAL HIGH (ref 70–99)
Potassium: 4.1 mEq/L (ref 3.5–5.3)
SODIUM: 137 meq/L (ref 135–145)
TOTAL PROTEIN: 7.3 g/dL (ref 6.0–8.3)
Total Bilirubin: 0.4 mg/dL (ref 0.3–1.2)

## 2013-08-07 LAB — POCT URINALYSIS DIPSTICK
BILIRUBIN UA: NEGATIVE
Blood, UA: NEGATIVE
GLUCOSE UA: NEGATIVE
Ketones, UA: NEGATIVE
LEUKOCYTES UA: NEGATIVE
NITRITE UA: NEGATIVE
Protein, UA: NEGATIVE
Spec Grav, UA: 1.015
UROBILINOGEN UA: NEGATIVE
pH, UA: 5

## 2013-08-07 LAB — POCT URINE PREGNANCY: PREG TEST UR: NEGATIVE

## 2013-08-07 NOTE — Progress Notes (Signed)
Pt in office today for annual exam, reports she is having low abd pressure with occasional cramping, last pap 2012 normal result normal

## 2013-08-07 NOTE — Progress Notes (Signed)
  Subjective:     Shelley Hardy is a 38 y.o. female and is here for a comprehensive physical exam. 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 States she has been lactating for some time. Stopped nursing her son a few months ago but continues to pump twice daily to relieve engorgement. Gets 1 bottle full each time. Has not gone longer than 8 hours due to discomfort. She throws the milk away.  Reports fullness in her neck, occasionally difficult to swallow. Reports she had an [place] 10 years ago and it was mentioned she could eventually have surgery if she wants.   History   Social History  . Marital Status: Divorced    Spouse Name: N/A    Number of Children: N/A  . Years of Education: N/A   Occupational History  . Not on file.   Social History Main Topics  . Smoking status: Never Smoker   . Smokeless tobacco: Never Used  . Alcohol Use: No  . Drug Use: No  . Sexual Activity: Yes    Birth Control/ Protection: None     Comment: Last intercourse 5 days ago   Other Topics Concern  . Not on file   Social History Narrative  . No narrative on file   Health Maintenance  Topic Date Due  . Pap Smear  10/02/1991  . Influenza Vaccine  02/13/2013  . Tetanus/tdap  03/08/2022    The following portions of the patient's history were reviewed and updated as appropriate: allergies, current medications, past family history, past medical history, past social history, past surgical history and problem list.  Review of Systems A comprehensive review of systems was negative.   Objective:    BP 107/70  Pulse 81  Temp(Src) 97.8 F (36.6 C)  Ht 5\' 6"  (1.676 m)  Wt 177 lb 8 oz (80.513 kg)  BMI 28.66 kg/m2 General appearance: alert Head: Normocephalic, without obvious abnormality, atraumatic Eyes: conjunctivae/corneas clear. PERRL, EOM's intact. Fundi benign. Ears: normal TM's and external ear canals both ears Nose: Nares normal. Septum midline. Mucosa normal.  No drainage or sinus tenderness. Throat: normal findings: lips normal without lesions Neck: no carotid bruit, no JVD and thyroid: enlarged and nodular Back: symmetric, no curvature. ROM normal. No CVA tenderness. Lungs: clear to auscultation bilaterally Breasts: normal appearance, no masses or tenderness Heart: regular rate and rhythm, S1, S2 normal, no murmur, click, rub or gallop Abdomen: soft, non-tender; bowel sounds normal; no masses,  no organomegaly Pelvic: cervix normal in appearance, external genitalia normal, no adnexal masses or tenderness, no cervical motion tenderness, rectovaginal septum normal, uterus normal size, shape, and consistency and vagina normal without discharge Extremities: extremities normal, atraumatic, no cyanosis or edema Pulses: 2+ and symmetric Skin: Skin color, texture, turgor normal. No rashes or lesions Lymph nodes: Cervical, supraclavicular, and axillary nodes normal. Neurologic: Alert and oriented X 3, normal strength and tone. Normal symmetric reflexes. Normal coordination and gait    Assessment:    Healthy female exam.  Thyroid enlargement Hx of prolactioma per patient report, +lactation. Patient pumping to relieve engorgement. Recommend contraception to avoid future pregnancy     Plan:     See After Visit Summary for Counseling Recommendations  Labs pending Neck US pending Patient referred to endocrinology Patient to RTC for contraception Pap/Annual today Mammogram ordered once 38 yo.   50 min spent with patient greater than 80% spent in counseling and coordination of care.   Burnette Valenti Roni Bread CNM

## 2013-08-08 LAB — HIV ANTIBODY (ROUTINE TESTING W REFLEX): HIV: NONREACTIVE

## 2013-08-08 LAB — THYROID PANEL WITH TSH
Free Thyroxine Index: 2.6 (ref 1.0–3.9)
T3 Uptake: 33.4 % (ref 22.5–37.0)
T4 TOTAL: 7.7 ug/dL (ref 5.0–12.5)
TSH: 0.489 u[IU]/mL (ref 0.350–4.500)

## 2013-08-08 LAB — HEPATITIS B SURFACE ANTIGEN: HEP B S AG: NEGATIVE

## 2013-08-08 LAB — PROLACTIN: PROLACTIN: 190.4 ng/mL

## 2013-08-08 LAB — T4, FREE: Free T4: 0.97 ng/dL (ref 0.80–1.80)

## 2013-08-08 LAB — RPR

## 2013-08-09 LAB — GC/CHLAMYDIA PROBE AMP
CT PROBE, AMP APTIMA: NEGATIVE
GC PROBE AMP APTIMA: NEGATIVE

## 2013-08-11 LAB — PAP IG AND HPV HIGH-RISK: HPV DNA High Risk: NOT DETECTED

## 2013-08-12 ENCOUNTER — Ambulatory Visit (HOSPITAL_COMMUNITY): Payer: Medicaid Other

## 2013-08-12 ENCOUNTER — Ambulatory Visit: Payer: Medicaid Other | Admitting: Endocrinology

## 2013-08-14 ENCOUNTER — Other Ambulatory Visit: Payer: Self-pay | Admitting: Advanced Practice Midwife

## 2013-08-14 ENCOUNTER — Ambulatory Visit (HOSPITAL_COMMUNITY)
Admission: RE | Admit: 2013-08-14 | Discharge: 2013-08-14 | Disposition: A | Discharge status: Home / Self Care | Payer: Medicaid Other | Source: Ambulatory Visit | Attending: Advanced Practice Midwife | Admitting: Advanced Practice Midwife

## 2013-08-14 DIAGNOSIS — Z01419 Encounter for gynecological examination (general) (routine) without abnormal findings: Secondary | ICD-10-CM

## 2013-08-14 DIAGNOSIS — R599 Enlarged lymph nodes, unspecified: Secondary | ICD-10-CM | POA: Insufficient documentation

## 2013-08-14 DIAGNOSIS — Z1231 Encounter for screening mammogram for malignant neoplasm of breast: Secondary | ICD-10-CM

## 2013-08-14 DIAGNOSIS — IMO0001 Reserved for inherently not codable concepts without codable children: Secondary | ICD-10-CM

## 2013-08-14 DIAGNOSIS — E039 Hypothyroidism, unspecified: Secondary | ICD-10-CM | POA: Insufficient documentation

## 2013-08-14 DIAGNOSIS — E049 Nontoxic goiter, unspecified: Secondary | ICD-10-CM | POA: Insufficient documentation

## 2013-08-19 ENCOUNTER — Ambulatory Visit: Payer: Medicaid Other | Admitting: Endocrinology

## 2013-08-25 ENCOUNTER — Ambulatory Visit: Payer: Medicaid Other | Admitting: Endocrinology

## 2013-08-25 ENCOUNTER — Encounter: Payer: Self-pay | Admitting: Endocrinology

## 2013-08-25 ENCOUNTER — Ambulatory Visit (INDEPENDENT_AMBULATORY_CARE_PROVIDER_SITE_OTHER): Payer: Medicaid Other | Admitting: Endocrinology

## 2013-08-25 VITALS — BP 110/80 | HR 65 | Temp 97.9°F | Resp 14 | Ht 66.0 in | Wt 177.2 lb

## 2013-08-25 DIAGNOSIS — E049 Nontoxic goiter, unspecified: Secondary | ICD-10-CM

## 2013-08-25 DIAGNOSIS — D352 Benign neoplasm of pituitary gland: Secondary | ICD-10-CM

## 2013-08-25 DIAGNOSIS — R51 Headache: Secondary | ICD-10-CM

## 2013-08-25 DIAGNOSIS — D353 Benign neoplasm of craniopharyngeal duct: Secondary | ICD-10-CM

## 2013-08-25 DIAGNOSIS — R519 Headache, unspecified: Secondary | ICD-10-CM

## 2013-08-25 MED ORDER — CABERGOLINE 0.5 MG PO TABS: 0.5000 mg | ORAL_TABLET | ORAL | Status: DC

## 2013-08-25 NOTE — Progress Notes (Signed)
Patient ID: Shelley Hardy, female   DOB: 1975/07/25, 38 y.o.   MRN: [id number]   Chief complaint: Absence of menstrual cycles  History of Present Illness:   Problem 1:  About 10 years ago she stopped having menstrual cycles and when she was evaluated subsequently she was found to have a prolactinoma. Details of this evaluation are not available Not clear when her first MRI scan was done, reports available only for the scans in 2011 in 2012 In 2012 MRI pituitary scan showed a 9 x 6 x 13 mm post contrast hyperintense mass anteriorly within the pituitary. This was not significantly changed in size with prior exam. She also had some milk discharge from her breasts.  She was treated with bromocriptine by her gynecologist about 3 years ago which he took this for only 4 months. Her menstrual cycles were normal with this. She had a pregnancy in 2013 and subsequently has continued to have amenorrhea and breast milk discharge. She does not complain of any peripheral visual difficulties although she thinks she has more difficulty with focusing when she is not wearing glasses. Last  Eye exam 2 years ago PROLACTIN level done in 07/2013 is 190  Problem 2: GOITER  She was found to have a thyroid enlargement over 10 years ago. She has had periodic evaluations of this and thyroid functions have been consistently normal. She feels that the goiter has been gradually increasing in size and is now much larger than initially. She does not feel any choking sensation but does at times feel pressure in the neck especially when lying down in certain positions. Sometimes she feels some bubbles in her throat. No difficulty with swallowing or breathing. No local discomfort. Thyroid scan report from 2004 was as follows: Low-normal thyroid uptake AT 12.8%. Impression: ABNORMAL THYROID SCAN, POSSIBLY DEMONSTRATING A HOT NODULE WITHIN THE RIGHT THYROID LOBE AND A COLD NODULE IN THE LEFT THYROID GLAND. RECOMMEND  CORRELATION WITH ULTRASOUND OF THE THYROID GLAND. January 2015 ultrasound shows no distinct nodule but diffuse enlargement, left lobe larger measuring 9.2 x 6.7 x 5.1 cm.  No symptoms of cold or heat intolerance, unusual fatigue.  Lab Results  Component Value Date   TSH 0.489 08/07/2013     Past Medical History  Diagnosis Date  . Pituitary tumor   . AMA (advanced maternal age) multigravida 4+   . Depression     post partum    No past surgical history on file.  Family History  Problem Relation Age of Onset  . Anemia Cousin     patient's first cousin once removed; parents of cousin are first cousins to each other and to the patient  . Diabetes Maternal Grandmother   . Thyroid disease Neg Hx     Social History:  reports that she has never smoked. She has never used smokeless tobacco. She reports that she does not drink alcohol or use illicit drugs.  Allergies: No Known Allergies    Medication List       This list is accurate as of: 08/25/13 12:09 PM.  Always use your most recent med list.               SE-NATAL 19 29-1 MG Tabs  TAKE 1 TABLET BY MOUTH EVERY DAY          REVIEW OF SYSTEMS:            She has occasional headaches which are generalized and not severe  No history of diabetes or  gestational diabetes       Skin: No rash or infections     His blood pressure has been normal.     No swelling of feet.     No shortness of breath or chest pain on exertion.     No abdominal pain.  Bowel habits:  No change.      No joint  pains.     PHYSICAL EXAM:  BP 110/80  Pulse 65  Temp(Src) 97.9 F (36.6 C)  Resp 14  Ht 5\' 6"  (1.676 m)  Wt 177 lb 3.2 oz (80.377 kg)  BMI 28.61 kg/m2  SpO2 99%  GENERAL: Averagely built and nourished, pleasant and cooperative  No pallor, clubbing, lymphadenopathy or edema.  Skin:  no rash or pigmentation.  EYES:  Externally normal. No proptosis or stare  ENT: Oral mucosa and tongue normal.   The neck  circumference is 38.5 cm. She has a very large bilateral thyroid enlargement which is about 4-5 times normal especially on the left side. It is somewhat nodular and soft to firm in consistency. No stridor heard. Pemberton sign is negative No obvious deviation of the trachea  HEART:  Normal  S1 and S2; no murmur or click.  CHEST:  Normal shape.  Lungs: Vescicular breath sounds heard equally.  No crepitations/ wheeze.  ABDOMEN:  No distention.  Liver and spleen not palpable.  No other mass or tenderness.  NEUROLOGICAL: .Reflexes are bilaterally normal at biceps.  JOINTS:  Normal.  ASSESSMENT:   1. 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 She has a relatively large 13 mm pituitary adenoma which has been untreated except for a short period of 4 months previously. Also has not had any enlargement of the gland on previous serial MRI scans. Has moderately increased prolactin of 190. She did have good response to taking bromocriptine, unknown dose previously. 2. Large goiter. This is a long-standing problem and the size of the goiter has been progressively increasing. Her ultrasound did not show any discrete nodules. She is only mildly symptomatic at this time and does not appear to have any tracheal compression symptoms or signs. Since her TSH is 0.49 she is not a candidate for suppression with levothyroxine and at this stage will probably not be effective anyway. Patient is not desiring surgery at this time.   PLAN:   1. Start Dostinex 0.5 mg, half tablet twice a week. If tolerated will increase to full tablet twice a week. This will be simpler to take and probably better tolerated than bromocriptine. Discussed how this works and benefits in reducing hyperprolactinemia in the size of the pituitary adenoma. She will have followup prolactin level done in 6 weeks and dosage adjusted. Duration of treatment will be determined by her response. She will also have baseline  visual field testing. 2. Her goiter will be observed clinically and will have periodic thyroid function tests done also. Discussed with the patient that if she has excessive local pressure symptoms she will be referred for surgery  [facility] 08/25/2013, 12:09 PM

## 2013-08-25 NOTE — Patient Instructions (Addendum)
First week take Cabergoline 1/2 tab, then 1 tab twice a week if no nausea

## 2013-09-16 ENCOUNTER — Institutional Professional Consult (permissible substitution): Payer: Medicaid Other | Admitting: Obstetrics & Gynecology

## 2013-09-29 ENCOUNTER — Encounter: Payer: Self-pay | Admitting: Obstetrics & Gynecology

## 2013-10-02 ENCOUNTER — Other Ambulatory Visit: Payer: Medicaid Other

## 2013-10-06 ENCOUNTER — Ambulatory Visit: Payer: Medicaid Other | Admitting: Endocrinology

## 2013-11-13 ENCOUNTER — Other Ambulatory Visit: Payer: Self-pay | Admitting: Obstetrics & Gynecology

## 2013-11-16 ENCOUNTER — Other Ambulatory Visit: Payer: Self-pay | Admitting: *Deleted

## 2013-11-16 DIAGNOSIS — O09529 Supervision of elderly multigravida, unspecified trimester: Secondary | ICD-10-CM

## 2013-11-16 MED ORDER — SE-NATAL 19 29-1 MG PO TABS: 1.0000 | ORAL_TABLET | Freq: Every day | ORAL | Status: DC

## 2013-12-08 ENCOUNTER — Other Ambulatory Visit: Payer: Self-pay | Admitting: Ophthalmology

## 2013-12-08 DIAGNOSIS — H4720 Unspecified optic atrophy: Secondary | ICD-10-CM

## 2013-12-13 ENCOUNTER — Other Ambulatory Visit: Payer: Medicaid Other

## 2013-12-18 ENCOUNTER — Other Ambulatory Visit: Payer: Medicaid Other

## 2013-12-22 ENCOUNTER — Ambulatory Visit (INDEPENDENT_AMBULATORY_CARE_PROVIDER_SITE_OTHER): Payer: Medicaid Other | Admitting: Neurology

## 2013-12-22 ENCOUNTER — Other Ambulatory Visit: Payer: Self-pay | Admitting: Neurology

## 2013-12-22 ENCOUNTER — Encounter: Payer: Self-pay | Admitting: Neurology

## 2013-12-22 VITALS — BP 113/79 | HR 69 | Ht 65.0 in | Wt 178.0 lb

## 2013-12-22 DIAGNOSIS — D353 Benign neoplasm of craniopharyngeal duct: Secondary | ICD-10-CM

## 2013-12-22 DIAGNOSIS — D352 Benign neoplasm of pituitary gland: Secondary | ICD-10-CM

## 2013-12-22 DIAGNOSIS — E049 Nontoxic goiter, unspecified: Secondary | ICD-10-CM

## 2013-12-22 MED ORDER — DICLOFENAC POTASSIUM(MIGRAINE) 50 MG PO PACK: 50.0000 mg | PACK | ORAL | Status: DC | PRN

## 2013-12-22 MED ORDER — NORTRIPTYLINE HCL 10 MG PO CAPS: ORAL_CAPSULE | ORAL | Status: DC

## 2013-12-22 NOTE — Progress Notes (Signed)
PATIENT: Shelley Hardy DOB: January 03, 1976  HISTORICAL  Shelley Hardy is a 38 years old right-handed [place] female, referred by ophthalmologist Dr. Gevena Cotton for evaluation of headaches , she has a history of pituitary tumor,  She had a past medical history of pituitary tumor, presented with amenorrhea and galactorrhea about 10 years ago.  She was treated with bromocriptine by her gynecologist about 3 years ago, around 2012, which she took this for only 4 months. Her menstrual cycles were normal with this. She had a pregnancy in 2013 and subsequently has continued to have amenorrhea and breast milk discharge.  She complains of headaches since 2012, progressive worsened, over the past one year, she has a 1-2 headaches each week, starting at occipital region, spreading forward to become bifrontal, bilateral retro-orbital areas severe pounding headaches, was associated light, noise sensitivity, nauseous, has taking of from work because of her severe headaches  Headaches last about a few hours, Excedrin Migraine provides some help,  She was recently evaluated by ophthalmologist Dr. Frederico Hamman, describe evidence of optic atrophy, MRI of the brain was ordered  She was also recently evaluated by her endocrinologist Dr. Dwyane Dee, elevated prolactin 190, she is now taking Dostinex 0.5 mg, large goiter, with normal thyroid function test, Since her TSH is 0.49 she is not a candidate for suppression with levothyroxine and at this stage will probably not be effective anyway. Not a surgical candidate either  She also complains of difficulty sleeping, frequent awakening at night time, to clear her throat,  Last MRI brain w/wo in Jan 2012, 9 x 6 x 13 mm post contrast hyperintense mass is seen anteriorly within the pituitary. This is not significantly changed in size with prior exam. The 13 mm measurement represents the diameter of the tumor in its mid section which was not visualized on the  previous scan. The other dimensions are stable. The pituitary stalk is midline. The optic chiasm is unremarkable. The conventional images of the brain demonstrate no evidence for  hemorrhage or mass lesion. There is no hydrocephalus. No significant extra-axial fluid collection is evident. The postcontrast images demonstrate no areas of pathologic enhancement.  She complains blurry vision, but can be corrected by glasses,  REVIEW OF SYSTEMS: Full 14 system review of systems performed and notable only for headaches, blurry vision, eye pain, snoring, feeling hot, ALLERGIES: No Known Allergies  HOME MEDICATIONS: Current Outpatient Prescriptions on File Prior to Visit  Medication Sig Dispense Refill  . cabergoline (DOSTINEX) 0.5 MG tablet Take 1 tablet (0.5 mg total) by mouth 2 (two) times a week.  10 tablet  2  . Prenatal Vit-DSS-Fe Fum-FA (SE-NATAL 19) 29-1 MG TABS Take 1 tablet by mouth daily.  30 tablet  11  . Prenatal Vit-Fe Fumarate-FA (PREPLUS) 27-1 MG TABS TAKE 1 TABLET BY MOUTH EVERY DAY  30 tablet  0     PAST MEDICAL HISTORY: Past Medical History  Diagnosis Date  . Pituitary tumor   . AMA (advanced maternal age) multigravida 18+   . Depression     post partum    PAST SURGICAL HISTORY: No past surgical history on file.  FAMILY HISTORY: Family History  Problem Relation Age of Onset  . Anemia Cousin     patient's first cousin once removed; parents of cousin are first cousins to each other and to the patient  . Diabetes Maternal Grandmother   . Thyroid disease Neg Hx     SOCIAL HISTORY:  History   Social History  .  Marital Status: Divorced    Spouse Name: N/A    Number of Children: 3  . Years of Education: N/A   Occupational History  inspection, at [employer]     Social History Main Topics  . Smoking status: Never Smoker   . Smokeless tobacco: Never Used  . Alcohol Use: No  . Drug Use: No  . Sexual Activity: Yes    Birth Control/ Protection: None      Comment: Last intercourse 5 days ago   Other Topics Concern  . Not on file   Social History Narrative  . No narrative on file   PHYSICAL EXAM   There were no vitals filed for this visit.  Not recorded    There is no weight on file to calculate BMI.   Generalized: In no acute distress  Neck: Supple, no carotid bruits   Cardiac: Regular rate rhythm  Pulmonary: Clear to auscultation bilaterally  Musculoskeletal: No deformity  Neurological examination  Mentation: Alert oriented to time, place, history taking, and causual conversation  Cranial nerve II-XII: Pupils were equal round reactive to light. Extraocular movements were full.  Visual field were full on confrontational test. Bilateral fundi were sharp.  Facial sensation and strength were normal. Hearing was intact to finger rubbing bilaterally. Uvula tongue midline.  Head turning and shoulder shrug and were normal and symmetric.Tongue protrusion into cheek strength was normal.  Motor: Normal tone, bulk and strength.  Sensory: Intact to fine touch, pinprick, preserved vibratory sensation, and proprioception at toes.  Coordination: Normal finger to nose, heel-to-shin bilaterally there was no truncal ataxia  Gait: Rising up from seated position without assistance, normal stance, without trunk ataxia, moderate stride, good arm swing, smooth turning, able to perform tiptoe, and heel walking without difficulty.   Romberg signs: Negative  Deep tendon reflexes: Brachioradialis 2/2, biceps 2/2, triceps 2/2, patellar 2/2, Achilles 2/2, plantar responses were flexor bilaterally.   DIAGNOSTIC DATA (LABS, IMAGING, TESTING) - I reviewed patient records, labs, notes, testing and imaging myself where available.  Lab Results  Component Value Date   WBC 5.4 08/07/2013   HGB 12.4 08/07/2013   HCT 36.8 08/07/2013   MCV 86.6 08/07/2013   PLT 250 08/07/2013      Component Value Date/Time   NA 137 08/07/2013 1406   K 4.1 08/07/2013 1406    CL 103 08/07/2013 1406   CO2 27 08/07/2013 1406   GLUCOSE 103* 08/07/2013 1406   BUN 12 08/07/2013 1406   CREATININE 0.50 08/07/2013 1406   CREATININE 0.39* 03/07/2012 0635   CALCIUM 9.5 08/07/2013 1406   PROT 7.3 08/07/2013 1406   ALBUMIN 4.4 08/07/2013 1406   AST 12 08/07/2013 1406   ALT 13 08/07/2013 1406   ALKPHOS 57 08/07/2013 1406   BILITOT 0.4 08/07/2013 1406   GFRNONAA >90 03/07/2012 0635   GFRAA >90 03/07/2012 0635   Lab Results  Component Value Date   CHOL 189 06/27/2007   HDL 72 06/27/2007   LDLCALC 107* 06/27/2007   TRIG 48 06/27/2007   CHOLHDL 2.6 Ratio 06/27/2007   No results found for this basename: HGBA1C   No results found for this basename: VITAMINB12   Lab Results  Component Value Date   TSH 0.489 08/07/2013      ASSESSMENT AND PLAN  Shelley Hardy is a 38 y.o. female with past medical history of pituitary tumor, now presenting with blurry vision, frequent headaches, with migraine features, normal neurological examinations  1, will follow up MRI result,  which was ordered by her ophthalmologist Dr. Frederico Hamman,  2 her headache has migraine features, nortriptyline 10 mg, titrating 20 mg every night as preventive measure 3, Cambia as needed for abortive treatment 4. RTC in 2-3 months with Hoyle Sauer.    Marcial Pacas, M.D. Ph.D.  [facility] [address] [place], [place] [address] [phone]

## 2013-12-22 NOTE — Telephone Encounter (Deleted)
Pt's pharmacy has already receive Rx.

## 2013-12-29 ENCOUNTER — Other Ambulatory Visit: Payer: Medicaid Other

## 2014-01-01 ENCOUNTER — Telehealth: Payer: Self-pay | Admitting: Neurology

## 2014-01-01 NOTE — Telephone Encounter (Signed)
I contacted ins, and they have denied our request for coverage on Cambia.  They will, however, cover Diclofenac tablets.  Would it be okay to change the patient to tablets so ins will pay?  Please advise.  Thank you.

## 2014-01-01 NOTE — Telephone Encounter (Signed)
Pt called states her pharmacy says that they can't fill this prescription per Insurance Co. Insurance Co states Dr. Krista Blue has to call and explain the reason for pt taking this medication Diclofenac Potassium (CAMBIA) 50 MG PACK.  Please call pt back concerning this matter once approved and per pt please call Insurance co concerning reason for pt being put on this medication.

## 2014-01-04 NOTE — Telephone Encounter (Signed)
Please let patient know, that it is Ok to change to Diclofenac tablet, but likely that it is less effective than the powder form

## 2014-01-05 ENCOUNTER — Ambulatory Visit
Admission: RE | Admit: 2014-01-05 | Discharge: 2014-01-05 | Disposition: A | Discharge status: Home / Self Care | Payer: Medicaid Other | Source: Ambulatory Visit | Attending: Ophthalmology | Admitting: Ophthalmology

## 2014-01-05 DIAGNOSIS — H4720 Unspecified optic atrophy: Secondary | ICD-10-CM

## 2014-01-05 MED ORDER — GADOBENATE DIMEGLUMINE 529 MG/ML IV SOLN
16.0000 mL | Freq: Once | INTRAVENOUS | Status: AC | PRN
  Administered 2014-01-05: 16 mL via INTRAVENOUS

## 2014-01-05 MED ORDER — DICLOFENAC SODIUM 50 MG PO TBEC: 50.0000 mg | DELAYED_RELEASE_TABLET | Freq: Every day | ORAL | Status: DC | PRN

## 2014-01-05 NOTE — Telephone Encounter (Signed)
I called the patient back.  Relayed info from Dr Rhea Belton note.  She verbalized understanding and will call us back if anything further is needed.

## 2014-02-23 ENCOUNTER — Telehealth: Payer: Self-pay | Admitting: Neurology

## 2014-02-23 NOTE — Telephone Encounter (Signed)
Patient stated medication isn't helping with Migraines.  Please return call anytime and can leave message if not available.

## 2014-02-24 NOTE — Telephone Encounter (Signed)
Patient is scheduled with carolyn on Friday for sooner appt. Hoyle Sauer had cx. Spoke with patient .

## 2014-02-26 ENCOUNTER — Ambulatory Visit (INDEPENDENT_AMBULATORY_CARE_PROVIDER_SITE_OTHER): Payer: Medicaid Other | Admitting: Nurse Practitioner

## 2014-02-26 ENCOUNTER — Encounter: Payer: Self-pay | Admitting: Nurse Practitioner

## 2014-02-26 VITALS — BP 105/66 | HR 80 | Ht 66.0 in | Wt 173.0 lb

## 2014-02-26 DIAGNOSIS — D352 Benign neoplasm of pituitary gland: Secondary | ICD-10-CM

## 2014-02-26 DIAGNOSIS — D353 Benign neoplasm of craniopharyngeal duct: Secondary | ICD-10-CM

## 2014-02-26 DIAGNOSIS — E049 Nontoxic goiter, unspecified: Secondary | ICD-10-CM

## 2014-02-26 DIAGNOSIS — R51 Headache: Secondary | ICD-10-CM

## 2014-02-26 DIAGNOSIS — R519 Headache, unspecified: Secondary | ICD-10-CM | POA: Insufficient documentation

## 2014-02-26 NOTE — Patient Instructions (Signed)
Increase Nortriptyline to 3 tabs at night Continue Diclofenic as needed Eliminate 1 food from list at a time Given infromation on migraine F/U in 3 months

## 2014-02-26 NOTE — Progress Notes (Signed)
[facility]  PATIENT: Shelley Hardy DOB: 1975-11-09   REASON FOR VISIT: follow up for headache    HISTORY OF PRESENT ILLNESS:Shelley Hardy, 38 year old female returns for followup she was last seen in this office by Dr. Krista Blue 12/22/2013. She was placed on nortriptyline which has been somewhat beneficial but still has some headaches. Shelley Hardy was ordered but her insurance would not cover so  she is taking diclofenac when necessary. She is not aware of any food triggers or other triggers. She returns for reevaluation  HISTORY:Shelley Hardy is a 38 years old right-handed [place] female, referred by ophthalmologist Dr. Gevena Cotton for evaluation of headaches , she has a history of pituitary tumor,  She had a past medical history of pituitary tumor, presented with amenorrhea and galactorrhea about 10 years ago. She was treated with bromocriptine by her gynecologist about 3 years ago, around 2012, which she took this for only 4 months. Her menstrual cycles were normal with this. She had a pregnancy in 2013 and subsequently has continued to have amenorrhea and breast milk discharge.  She complains of headaches since 2012, progressive worsened, over the past one year, she has a 1-2 headaches each week, starting at occipital region, spreading forward to become bifrontal, bilateral retro-orbital areas severe pounding headaches, was associated light, noise sensitivity, nauseous, has taking of from work because of her severe headaches  Headaches last about a few hours, Excedrin Migraine provides some help,  She was recently evaluated by ophthalmologist Dr. Frederico Hamman, describe evidence of optic atrophy, MRI of the brain was ordered  She was also recently evaluated by her endocrinologist Dr. Dwyane Dee, elevated prolactin 190, she is now taking Dostinex 0.5 mg, large goiter, with normal thyroid function test, Since her TSH is 0.49 she is not a candidate for suppression with  levothyroxine and at this stage will probably not be effective anyway. Not a surgical candidate either  She also complains of difficulty sleeping, frequent awakening at night time, to clear her throat,  Last MRI brain w/wo in Jan 2012, 9 x 6 x 13 mm post contrast hyperintense mass is seen anteriorly within the pituitary. This is not significantly changed in size with prior exam. The 13 mm measurement represents the diameter of the tumor in its mid section which was not visualized on the previous scan. The other dimensions are stable. The pituitary stalk is midline. The optic chiasm is unremarkable. The conventional images of the brain demonstrate no evidence for  hemorrhage or mass lesion. There is no hydrocephalus. No significant extra-axial fluid collection is evident. The postcontrast images demonstrate no areas of pathologic enhancement.  She complains blurry vision, but can be corrected by glasses,  REVIEW OF SYSTEMS: Full 14 system review of systems performed and notable only for those listed, all others are neg:  Constitutional: Fatigue Cardiovascular: N/A  Ear/Nose/Throat: N/A  Skin: N/A  Eyes: Blurred vision Respiratory: N/A  Gastroitestinal: N/A  Hematology/Lymphatic: N/A  Endocrine: N/A Musculoskeletal:N/A  Allergy/Immunology: N/A  Neurological: Headache Psychiatric: N/A Sleep : NA   ALLERGIES: No Known Allergies  HOME MEDICATIONS: Outpatient Prescriptions Prior to Visit  Medication Sig Dispense Refill  . cabergoline (DOSTINEX) 0.5 MG tablet Take 1 tablet (0.5 mg total) by mouth 2 (two) times a week.  10 tablet  2  . diclofenac (VOLTAREN) 50 MG EC tablet Take 1 tablet (50 mg total) by mouth daily as needed.  30 tablet  12  . nortriptyline (PAMELOR) 10 MG capsule One po qhs xone week,  2 tabs po qhs  60 capsule  12  . Prenatal Vit-DSS-Fe Fum-FA (SE-NATAL 19) 29-1 MG TABS Take 1 tablet by mouth daily.  30 tablet  11  . Prenatal Vit-Fe Fumarate-FA (PREPLUS) 27-1 MG TABS TAKE 1  TABLET BY MOUTH EVERY DAY  30 tablet  0   No facility-administered medications prior to visit.    PAST MEDICAL HISTORY: Past Medical History  Diagnosis Date  . Pituitary tumor   . AMA (advanced maternal age) multigravida 55+   . Depression     post partum    PAST SURGICAL HISTORY: Past Surgical History  Procedure Laterality Date  . None      FAMILY HISTORY: Family History  Problem Relation Age of Onset  . Anemia Cousin     patient's first cousin once removed; parents of cousin are first cousins to each other and to the patient  . Diabetes Maternal Grandmother   . Thyroid disease Neg Hx     SOCIAL HISTORY: History   Social History  . Marital Status: Married    Spouse Name: N/A    Number of Children: 3   . Years of Education: N/A   Occupational History  .     Social History Main Topics  . Smoking status: Never Smoker   . Smokeless tobacco: Never Used  . Alcohol Use: No  . Drug Use: No  . Sexual Activity: Yes    Birth Control/ Protection: None     Comment: Last intercourse 5 days ago   Other Topics Concern  . Not on file   Social History Narrative   Patient is married and lives at home with her husband.   Education associates degree.   Right handed.   Caffeine none      PHYSICAL EXAM  Filed Vitals:   02/26/14 1533  BP: 105/66  Pulse: 80  Height: 5\' 6"  (1.676 m)  Weight: 173 lb (78.472 kg)   Body mass index is 27.94 kg/(m^2). Generalized: In no acute distress  Neck: Supple, no carotid bruits  Cardiac: Regular rate rhythm  Pulmonary: Clear to auscultation bilaterally  Musculoskeletal: No deformity  Neurological examination  Mentation: Alert oriented to time, place, history taking, and causual conversation  Cranial nerve II-XII: Pupils were equal round reactive to light. Extraocular movements were full. Visual field were full on confrontational test. Bilateral fundi were sharp. Facial sensation and strength were normal. Hearing was intact to  finger rubbing bilaterally. Uvula tongue midline. Head turning and shoulder shrug and were normal and symmetric.Tongue protrusion into cheek strength was normal.  Motor: Normal tone, bulk and strength.  Sensory: Intact to fine touch, pinprick, preserved vibratory sensation, and proprioception at toes.  Coordination: Normal finger to nose, heel-to-shin bilaterally there was no truncal ataxia  Gait: Rising up from seated position without assistance, normal stance, moderate stride, good arm swing, smooth turning, able to perform tiptoe, and heel walking without difficulty.  Romberg signs: Negative  Deep tendon reflexes: Brachioradialis 2/2, biceps 2/2, triceps 2/2, patellar 2/2, Achilles 2/2, plantar responses were flexor bilaterally.    DIAGNOSTIC DATA (LABS, IMAGING, TESTING) - I reviewed patient records, labs, notes, testing and imaging myself where available.  Lab Results  Component Value Date   WBC 5.4 08/07/2013   HGB 12.4 08/07/2013   HCT 36.8 08/07/2013   MCV 86.6 08/07/2013   PLT 250 08/07/2013      Component Value Date/Time   NA 137 08/07/2013 1406   K 4.1 08/07/2013 1406   CL 103  08/07/2013 1406   CO2 27 08/07/2013 1406   GLUCOSE 103* 08/07/2013 1406   BUN 12 08/07/2013 1406   CREATININE 0.50 08/07/2013 1406   CREATININE 0.39* 03/07/2012 0635   CALCIUM 9.5 08/07/2013 1406   PROT 7.3 08/07/2013 1406   ALBUMIN 4.4 08/07/2013 1406   AST 12 08/07/2013 1406   ALT 13 08/07/2013 1406   ALKPHOS 57 08/07/2013 1406   BILITOT 0.4 08/07/2013 1406   GFRNONAA >90 03/07/2012 0635   GFRAA >90 03/07/2012 5750    he gives a he will Lab Results  Component Value Date   TSH 0.489 08/07/2013      ASSESSMENT AND PLAN  38 y.o. year old female  has a past medical history of headache, Pituitary tumor; and Depression. here to follow up. She has a normal neurologic exam .She is a patient of Dr. Krista Blue who is out of the office   Increase Nortriptyline to 3 tabs at night Continue Diclofenic as  needed Eliminate 1 food from list of migraine triggers at a time, list given to patient Given infromation on migraine F/U in 3 months Dennie Bible, [facility], [facility], [facility] [address], [place], [place] [address] [phone]

## 2014-03-01 ENCOUNTER — Encounter: Payer: Self-pay | Admitting: Internal Medicine

## 2014-03-01 ENCOUNTER — Ambulatory Visit (HOSPITAL_COMMUNITY)
Admission: RE | Admit: 2014-03-01 | Discharge: 2014-03-01 | Disposition: A | Discharge status: Home / Self Care | Payer: Medicaid Other | Source: Ambulatory Visit | Attending: Internal Medicine | Admitting: Internal Medicine

## 2014-03-01 ENCOUNTER — Ambulatory Visit: Payer: Medicaid Other | Attending: Internal Medicine | Admitting: Internal Medicine

## 2014-03-01 VITALS — BP 102/68 | HR 66 | Temp 99.1°F | Resp 16 | Ht 66.0 in | Wt 173.0 lb

## 2014-03-01 DIAGNOSIS — M25561 Pain in right knee: Secondary | ICD-10-CM

## 2014-03-01 DIAGNOSIS — Z87898 Personal history of other specified conditions: Secondary | ICD-10-CM | POA: Diagnosis not present

## 2014-03-01 DIAGNOSIS — M62838 Other muscle spasm: Secondary | ICD-10-CM

## 2014-03-01 DIAGNOSIS — F329 Major depressive disorder, single episode, unspecified: Secondary | ICD-10-CM | POA: Diagnosis not present

## 2014-03-01 DIAGNOSIS — M25569 Pain in unspecified knee: Secondary | ICD-10-CM | POA: Diagnosis not present

## 2014-03-01 DIAGNOSIS — IMO0002 Reserved for concepts with insufficient information to code with codable children: Secondary | ICD-10-CM | POA: Diagnosis not present

## 2014-03-01 DIAGNOSIS — Z139 Encounter for screening, unspecified: Secondary | ICD-10-CM

## 2014-03-01 DIAGNOSIS — E049 Nontoxic goiter, unspecified: Secondary | ICD-10-CM

## 2014-03-01 DIAGNOSIS — F3289 Other specified depressive episodes: Secondary | ICD-10-CM | POA: Diagnosis not present

## 2014-03-01 DIAGNOSIS — Z0289 Encounter for other administrative examinations: Secondary | ICD-10-CM

## 2014-03-01 DIAGNOSIS — R51 Headache: Secondary | ICD-10-CM

## 2014-03-01 DIAGNOSIS — R519 Headache, unspecified: Secondary | ICD-10-CM

## 2014-03-01 MED ORDER — IBUPROFEN 600 MG PO TABS: 600.0000 mg | ORAL_TABLET | Freq: Three times a day (TID) | ORAL | Status: DC | PRN

## 2014-03-01 MED ORDER — CYCLOBENZAPRINE HCL 10 MG PO TABS: 10.0000 mg | ORAL_TABLET | Freq: Three times a day (TID) | ORAL | Status: DC | PRN

## 2014-03-01 NOTE — Progress Notes (Signed)
C/C Headaches daily, numbness on Rt hand and fingers, Lt knee and pain

## 2014-03-01 NOTE — Progress Notes (Signed)
Patient Demographics  Shelley Hardy, is a 38 y.o. female  ZOX:[id number]  [id number]  DOB - 23-Nov-1975  CC:  Chief Complaint  Patient presents with  . Headache  . Knee Pain    Rt       HPI: Shelley Hardy is a 38 y.o. female here today to establish medical care. Patient has history of prolactinoma, goiter patient had been following up with endocrinologist, as per patient as she is breast-feeding currently is not taking Dostinex which was prescribed by her endocrinologist, she also has a chronic headache following up with the neurologist recently her medication dose was increased not typical in the 30 mg each bedtime, as per patient she has not started yet, she is complaining of some neck muscle tightness, also reported to have right knee pain for the last few weeks denies any fall or trauma. Patient has No headache, No chest pain, No abdominal pain - No Nausea, No new weakness tingling or numbness, No Cough - SOB.  No Known Allergies Past Medical History  Diagnosis Date  . Pituitary tumor   . AMA (advanced maternal age) multigravida 7+   . Depression     post partum   Current Outpatient Prescriptions on File Prior to Visit  Medication Sig Dispense Refill  . cabergoline (DOSTINEX) 0.5 MG tablet Take 1 tablet (0.5 mg total) by mouth 2 (two) times a week.  10 tablet  2  . diclofenac (VOLTAREN) 50 MG EC tablet Take 1 tablet (50 mg total) by mouth daily as needed.  30 tablet  12  . nortriptyline (PAMELOR) 10 MG capsule Take 30 mg by mouth at bedtime.      . Prenatal Vit-DSS-Fe Fum-FA (SE-NATAL 19) 29-1 MG TABS Take 1 tablet by mouth daily.  30 tablet  11  . Prenatal Vit-Fe Fumarate-FA (PREPLUS) 27-1 MG TABS TAKE 1 TABLET BY MOUTH EVERY DAY  30 tablet  0   No current facility-administered medications on file prior to visit.   Family History  Problem Relation Age of Onset  . Anemia Cousin     patient's first cousin once removed; parents of cousin are first cousins  to each other and to the patient  . Diabetes Maternal Grandmother   . Thyroid disease Maternal Aunt    History   Social History  . Marital Status: Married    Spouse Name: N/A    Number of Children: 3   . Years of Education: N/A   Occupational History  .     Social History Main Topics  . Smoking status: Never Smoker   . Smokeless tobacco: Never Used  . Alcohol Use: No  . Drug Use: No  . Sexual Activity: Yes    Birth Control/ Protection: None     Comment: Last intercourse 5 days ago   Other Topics Concern  . Not on file   Social History Narrative   Patient is married and lives at home with her husband.   Education associates degree.   Right handed.   Caffeine none     Review of Systems: Constitutional: Negative for fever, chills, diaphoresis, activity change, appetite change and fatigue. HENT: Negative for ear pain, nosebleeds, congestion, facial swelling, rhinorrhea, neck pain, neck stiffness and ear discharge.  Eyes: Negative for pain, discharge, redness, itching and visual disturbance. Respiratory: Negative for cough, choking, chest tightness, shortness of breath, wheezing and stridor.  Cardiovascular: Negative for chest pain, palpitations and leg swelling. Gastrointestinal: Negative for abdominal distention. Genitourinary: Negative for  dysuria, urgency, frequency, hematuria, flank pain, decreased urine volume, difficulty urinating and dyspareunia.  Musculoskeletal: Negative for back pain, joint swelling, arthralgia and gait problem. Neurological: Negative for dizziness, tremors, seizures, syncope, facial asymmetry, speech difficulty, weakness, light-headedness, numbness and headaches.  Hematological: Negative for adenopathy. Does not bruise/bleed easily. Psychiatric/Behavioral: Negative for hallucinations, behavioral problems, confusion, dysphoric mood, decreased concentration and agitation.    Objective:   Filed Vitals:   03/01/14 1608  BP: 102/68  Pulse: 66    Temp: 99.1 F (37.3 C)  Resp: 16    Physical Exam: Constitutional: Patient appears well-developed and well-nourished. No distress. HENT: Normocephalic, atraumatic, External right and left ear normal. Oropharynx is clear and moist.  Eyes: Conjunctivae and EOM are normal. PERRLA, no scleral icterus. Neck: Normal ROM. Neck supple. No JVD. No tracheal deviation. + thyromegaly. CVS: RRR, S1/S2 +, no murmurs, no gallops, no carotid bruit.  Pulmonary: Effort and breath sounds normal, no stridor, rhonchi, wheezes, rales.  Abdominal: Soft. BS +, no distension, tenderness, rebound or guarding.  Musculoskeletal: Normal range of motion. No edema and no tenderness.left knee crepitation+  Neuro: Alert. Normal reflexes, muscle tone coordination. No cranial nerve deficit. Skin: Skin is warm and dry. No rash noted. Not diaphoretic. No erythema. No pallor. Psychiatric: Normal mood and affect. Behavior, judgment, thought content normal.  Lab Results  Component Value Date   WBC 5.4 08/07/2013   HGB 12.4 08/07/2013   HCT 36.8 08/07/2013   MCV 86.6 08/07/2013   PLT 250 08/07/2013   Lab Results  Component Value Date   CREATININE 0.50 08/07/2013   BUN 12 08/07/2013   NA 137 08/07/2013   K 4.1 08/07/2013   CL 103 08/07/2013   CO2 27 08/07/2013    No results found for this basename: HGBA1C   Lipid Panel     Component Value Date/Time   CHOL 189 06/27/2007 2027   TRIG 48 06/27/2007 2027   HDL 72 06/27/2007 2027   CHOLHDL 2.6 Ratio 06/27/2007 2027   VLDL 10 06/27/2007 2027   LDLCALC 107* 06/27/2007 2027       Assessment and plan:   1. Chronic nonintractable headache, unspecified headache type Currently patient is following up with her neurologist and is going to take nortriptyline 30 mg each bedtime  2. Right knee pain Have ordered her an x-ray , prescribed ibuprofen when necessary. - ibuprofen (ADVIL,MOTRIN) 600 MG tablet; Take 1 tablet (600 mg total) by mouth every 8 (eight) hours as needed.   Dispense: 30 tablet; Refill: 1 - DG Knee Complete 4 Views Right; Future  3. Muscle spasm Advised patient to apply heating pad, trial of Flexeril each bedtime. - cyclobenzaprine (FLEXERIL) 10 MG tablet; Take 1 tablet (10 mg total) by mouth 3 (three) times daily as needed for muscle spasms.  Dispense: 30 tablet; Refill: 0  4. Goiter We'll repeat TSH level, patient will follow with her endocrinologist., - TSH  5. Screening Ordered baseline blood work. - Hemoglobin A1c - CBC with Differential - COMPLETE METABOLIC PANEL WITH GFR - Lipid panel - Vit D  25 hydroxy (rtn osteoporosis monitoring)   Return in about 3 months (around 06/01/2014).     Lorayne Marek, MD

## 2014-03-01 NOTE — Progress Notes (Signed)
I agree with the assessment and plan as directed by NP .The patient is known to me .   Kinze Labo, MD  

## 2014-03-02 ENCOUNTER — Telehealth: Payer: Self-pay | Admitting: *Deleted

## 2014-03-02 ENCOUNTER — Telehealth: Payer: Self-pay

## 2014-03-02 LAB — VITAMIN D 25 HYDROXY (VIT D DEFICIENCY, FRACTURES): Vit D, 25-Hydroxy: 27 ng/mL — ABNORMAL LOW (ref 30–89)

## 2014-03-02 LAB — CBC WITH DIFFERENTIAL/PLATELET
BASOS ABS: 0 10*3/uL (ref 0.0–0.1)
Basophils Relative: 0 % (ref 0–1)
Eosinophils Absolute: 0.1 10*3/uL (ref 0.0–0.7)
Eosinophils Relative: 2 % (ref 0–5)
HCT: 34.8 % — ABNORMAL LOW (ref 36.0–46.0)
Hemoglobin: 11.7 g/dL — ABNORMAL LOW (ref 12.0–15.0)
LYMPHS PCT: 56 % — AB (ref 12–46)
Lymphs Abs: 3.3 10*3/uL (ref 0.7–4.0)
MCH: 28.6 pg (ref 26.0–34.0)
MCHC: 33.6 g/dL (ref 30.0–36.0)
MCV: 85.1 fL (ref 78.0–100.0)
Monocytes Absolute: 0.3 10*3/uL (ref 0.1–1.0)
Monocytes Relative: 5 % (ref 3–12)
NEUTROS ABS: 2.2 10*3/uL (ref 1.7–7.7)
NEUTROS PCT: 37 % — AB (ref 43–77)
PLATELETS: 261 10*3/uL (ref 150–400)
RBC: 4.09 MIL/uL (ref 3.87–5.11)
RDW: 13.4 % (ref 11.5–15.5)
WBC: 5.9 10*3/uL (ref 4.0–10.5)

## 2014-03-02 LAB — COMPLETE METABOLIC PANEL WITH GFR
ALT: 12 U/L (ref 0–35)
AST: 13 U/L (ref 0–37)
Albumin: 4.3 g/dL (ref 3.5–5.2)
Alkaline Phosphatase: 52 U/L (ref 39–117)
BILIRUBIN TOTAL: 0.2 mg/dL (ref 0.2–1.2)
BUN: 12 mg/dL (ref 6–23)
CO2: 27 meq/L (ref 19–32)
CREATININE: 0.52 mg/dL (ref 0.50–1.10)
Calcium: 9.4 mg/dL (ref 8.4–10.5)
Chloride: 103 mEq/L (ref 96–112)
GFR, Est African American: >89 mL/min
Glucose, Bld: 93 mg/dL (ref 70–99)
Potassium: 4.8 mEq/L (ref 3.5–5.3)
Sodium: 140 mEq/L (ref 135–145)
Total Protein: 6.8 g/dL (ref 6.0–8.3)

## 2014-03-02 LAB — HEMOGLOBIN A1C
Hgb A1c MFr Bld: 5.1 % (ref <5.7)
MEAN PLASMA GLUCOSE: 100 mg/dL (ref <117)

## 2014-03-02 LAB — LIPID PANEL
CHOLESTEROL: 161 mg/dL (ref 0–200)
HDL: 49 mg/dL (ref >39)
LDL Cholesterol: 33 mg/dL (ref 0–99)
TRIGLYCERIDES: 394 mg/dL — AB (ref <150)
Total CHOL/HDL Ratio: 3.3 Ratio
VLDL: 79 mg/dL — AB (ref 0–40)

## 2014-03-02 LAB — TSH: TSH: 0.452 u[IU]/mL (ref 0.350–4.500)

## 2014-03-02 NOTE — Telephone Encounter (Signed)
Message copied by Dorothe Pea on Tue Mar 02, 2014 11:03 AM ------      Message from: Lorayne Marek      Created: Tue Mar 02, 2014  9:23 AM       Blood work reviewed, noticed elevated triglycerides, advise patient for low fat diet.       Also  noticed low vitamin D, advise patient to start taking OTC 2000 units daily.      Patient has borderline anemia, advise her to take over-the-counter iron supplement daily. ------

## 2014-03-02 NOTE — Telephone Encounter (Signed)
Interpreter line used Patient does speak english as well Patient is aware of her lab results

## 2014-03-02 NOTE — Telephone Encounter (Signed)
Message copied by Joan Mayans on Tue Mar 02, 2014 10:58 AM ------      Message from: Lorayne Marek      Created: Tue Mar 02, 2014  9:23 AM       Blood work reviewed, noticed elevated triglycerides, advise patient for low fat diet.       Also  noticed low vitamin D, advise patient to start taking OTC 2000 units daily.      Patient has borderline anemia, advise her to take over-the-counter iron supplement daily. ------

## 2014-03-02 NOTE — Telephone Encounter (Signed)
Pt is aware of her X rays and lab results.

## 2014-03-15 ENCOUNTER — Encounter: Payer: Self-pay | Admitting: Nurse Practitioner

## 2014-03-16 ENCOUNTER — Telehealth: Payer: Self-pay | Admitting: *Deleted

## 2014-03-16 NOTE — Telephone Encounter (Signed)
Letter at front desk

## 2014-03-18 ENCOUNTER — Encounter: Payer: Self-pay | Admitting: Nurse Practitioner

## 2014-03-18 ENCOUNTER — Other Ambulatory Visit: Payer: Medicaid Other

## 2014-03-23 ENCOUNTER — Encounter: Payer: Self-pay | Admitting: *Deleted

## 2014-03-23 ENCOUNTER — Ambulatory Visit: Payer: Medicaid Other | Admitting: Endocrinology

## 2014-03-29 ENCOUNTER — Ambulatory Visit: Payer: Medicaid Other | Admitting: Obstetrics & Gynecology

## 2014-04-01 ENCOUNTER — Ambulatory Visit: Payer: Medicaid Other | Admitting: Obstetrics & Gynecology

## 2014-04-01 ENCOUNTER — Ambulatory Visit: Payer: Medicaid Other | Admitting: Nurse Practitioner

## 2014-04-08 ENCOUNTER — Ambulatory Visit: Payer: Medicaid Other | Admitting: Obstetrics & Gynecology

## 2014-04-09 ENCOUNTER — Ambulatory Visit (INDEPENDENT_AMBULATORY_CARE_PROVIDER_SITE_OTHER): Payer: Medicaid Other | Admitting: Obstetrics & Gynecology

## 2014-04-09 VITALS — BP 108/69 | HR 81 | Temp 98.3°F | Wt 174.0 lb

## 2014-04-09 DIAGNOSIS — D353 Benign neoplasm of craniopharyngeal duct: Secondary | ICD-10-CM

## 2014-04-09 DIAGNOSIS — Z3202 Encounter for pregnancy test, result negative: Secondary | ICD-10-CM

## 2014-04-09 DIAGNOSIS — Z30011 Encounter for initial prescription of contraceptive pills: Secondary | ICD-10-CM

## 2014-04-09 DIAGNOSIS — D352 Benign neoplasm of pituitary gland: Secondary | ICD-10-CM

## 2014-04-09 DIAGNOSIS — Z3009 Encounter for other general counseling and advice on contraception: Secondary | ICD-10-CM

## 2014-04-09 LAB — POCT URINE PREGNANCY: Preg Test, Ur: NEGATIVE

## 2014-04-09 MED ORDER — NORETHIN ACE-ETH ESTRAD-FE 1-20 MG-MCG(24) PO TABS: 1.0000 | ORAL_TABLET | Freq: Every day | ORAL | Status: DC

## 2014-04-10 ENCOUNTER — Encounter: Payer: Self-pay | Admitting: Obstetrics & Gynecology

## 2014-04-10 LAB — WET PREP BY MOLECULAR PROBE
CANDIDA SPECIES: POSITIVE — AB
Gardnerella vaginalis: NEGATIVE
TRICHOMONAS VAG: NEGATIVE

## 2014-04-10 NOTE — Progress Notes (Signed)
Patient ID: Shelley Hardy, female   DOB: December 29, 1975, 38 y.o.   MRN: [id number]  Chief Complaint  Patient presents with  . Abdominal pain, irregular menses, vaginal discharge    HPI Shelley Hardy is a 38 y.o. female.   Abdominal Cramping This is a new problem. The current episode started 1 to 4 weeks ago. The onset quality is undetermined. The problem occurs intermittently. The problem has been unchanged. The abdominal pain does not radiate. Associated symptoms include nausea. Pertinent negatives include no constipation, diarrhea, dysuria, fever, frequency or vomiting. Nothing aggravates the pain. She has tried nothing for the symptoms.  Vaginal Bleeding The patient's primary symptoms include a vaginal discharge. Vaginal bleeding discharge: Irregular menses. Associated symptoms include nausea. Pertinent negatives include no constipation, diarrhea, dysuria, fever, frequency or vomiting.  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  Recently stopped breast feeding.  Past Medical History  Diagnosis Date  . Pituitary tumor   . AMA (advanced maternal age) multigravida 40+   . Depression     post partum    Past Surgical History  Procedure Laterality Date  . None      Family History  Problem Relation Age of Onset  . Anemia Cousin     patient's first cousin once removed; parents of cousin are first cousins to each other and to the patient  . Diabetes Maternal Grandmother   . Thyroid disease Maternal Aunt     Social History History  Substance Use Topics  . Smoking status: Never Smoker   . Smokeless tobacco: Never Used  . Alcohol Use: No    No Known Allergies  Current Outpatient Prescriptions  Medication Sig Dispense Refill  . ibuprofen (ADVIL,MOTRIN) 600 MG tablet Take 1 tablet (600 mg total) by mouth every 8 (eight) hours as needed.  30 tablet  1  . nortriptyline (PAMELOR) 10 MG capsule Take 30 mg by mouth at bedtime.      . Prenatal Vit-Fe Fumarate-FA (PREPLUS) 27-1  MG TABS TAKE 1 TABLET BY MOUTH EVERY DAY  30 tablet  0  . cabergoline (DOSTINEX) 0.5 MG tablet Take 1 tablet (0.5 mg total) by mouth 2 (two) times a week.  10 tablet  2  . cyclobenzaprine (FLEXERIL) 10 MG tablet Take 1 tablet (10 mg total) by mouth 3 (three) times daily as needed for muscle spasms.  30 tablet  0  . diclofenac (VOLTAREN) 50 MG EC tablet Take 1 tablet (50 mg total) by mouth daily as needed.  30 tablet  12  . Norethindrone Acetate-Ethinyl Estrad-FE (LOESTRIN 24 FE) 1-20 MG-MCG(24) tablet Take 1 tablet by mouth daily.  1 Package  11  . Prenatal Vit-DSS-Fe Fum-FA (SE-NATAL 19) 29-1 MG TABS Take 1 tablet by mouth daily.  30 tablet  11   No current facility-administered medications for this visit.    Review of Systems Review of Systems  Constitutional: Negative for fever.  Gastrointestinal: Positive for nausea. Negative for vomiting, diarrhea and constipation.  Genitourinary: Positive for vaginal bleeding and vaginal discharge. Negative for dysuria and frequency.   Constitutional: negative for fatigue and weight loss Respiratory: negative for cough and wheezing Cardiovascular: negative for chest pain, fatigue and palpitations Integument/breast: negative for nipple discharge Musculoskeletal:negative for myalgias Neurological: negative for gait problems and tremors Behavioral/Psych: negative for abusive relationship, depression Endocrine: negative for temperature intolerance     Blood pressure 108/69, pulse 81, temperature 98.3 F (36.8 C), weight 78.926 kg (174 lb), currently breastfeeding.  Physical Exam Physical Exam General:  alert  Skin:   no rash or abnormalities  Lungs:   clear to auscultation bilaterally  Heart:   regular rate and rhythm, S1, S2 normal, no murmur, click, rub or gallop  Abdomen:  normal findings: no organomegaly, soft, non-tender and no hernia  Pelvis:  External genitalia: normal general appearance Urinary system: urethral meatus normal and bladder  without fullness, nontender Vaginal: thick, white vaginal discharge Cervix: normal appearance Adnexa: normal bimanual exam Uterus: anteverted and non-tender, normal size   Limited pelvic U/S:  Small, RV uterus, minimal FF posterior CDS, ovaries wnl   Data Reviewed UPT  Assessment    Irregular menses in the setting of lactation/h/o a pituitary microdenoma ?Vaginitis ?Etiology of lower abdominal cramping--?leaking ovarian cyst    Plan    Orders Placed This Encounter  Procedures  . WET PREP BY MOLECULAR PROBE  . NMR Lipoprofile with Lipids  . POCT urine pregnancy   Meds ordered this encounter  Medications  . Norethindrone Acetate-Ethinyl Estrad-FE (LOESTRIN 24 FE) 1-20 MG-MCG(24) tablet    Sig: Take 1 tablet by mouth daily.    Dispense:  1 Package    Refill:  11    Keep a menstrual calendar NSAIDS prn Follow up as needed or in a few months         JACKSON-MOORE,Jadalynn Burr A 04/10/2014, 1:40 PM

## 2014-04-12 LAB — NMR LIPOPROFILE WITH LIPIDS
Cholesterol, Total: 179 mg/dL (ref 100–199)
HDL Particle Number: 27.9 umol/L — ABNORMAL LOW (ref >=30.5)
HDL SIZE: 9.4 nm (ref >=9.2)
HDL-C: 46 mg/dL (ref >39)
LDL CALC: 87 mg/dL (ref 0–99)
LDL PARTICLE NUMBER: 836 nmol/L (ref <1000)
LDL Size: 20.9 nm (ref >=20.8)
LP-IR Score: 47 — ABNORMAL HIGH (ref <=45)
Large HDL-P: 8.6 umol/L (ref >=4.8)
Large VLDL-P: 9.3 nmol/L — ABNORMAL HIGH (ref <=2.7)
Small LDL Particle Number: 280 nmol/L (ref <=527)
Triglycerides: 228 mg/dL — ABNORMAL HIGH (ref 0–149)
VLDL SIZE: 47.7 nm — AB (ref <=46.6)

## 2014-04-13 ENCOUNTER — Other Ambulatory Visit: Payer: Self-pay | Admitting: Endocrinology

## 2014-04-14 ENCOUNTER — Encounter: Payer: Self-pay | Admitting: *Deleted

## 2014-04-14 ENCOUNTER — Ambulatory Visit: Payer: Medicaid Other | Admitting: Endocrinology

## 2014-04-14 ENCOUNTER — Telehealth: Payer: Self-pay | Admitting: Endocrinology

## 2014-04-14 LAB — PROLACTIN: Prolactin: 4.7 ng/mL

## 2014-04-14 NOTE — Telephone Encounter (Signed)
Patient no showed today's appt. Please advise on how to follow up. °A. No follow up necessary. °B. Follow up urgent. Contact patient immediately. °C. Follow up necessary. Contact patient and schedule visit in ___ days. °D. Follow up advised. Contact patient and schedule visit in ____weeks. ° °

## 2014-04-14 NOTE — Telephone Encounter (Signed)
This is the 3rd time the patient has no showed.

## 2014-04-15 NOTE — Telephone Encounter (Signed)
Forms are on your desk.

## 2014-04-15 NOTE — Telephone Encounter (Signed)
dismiss

## 2014-04-16 ENCOUNTER — Other Ambulatory Visit: Payer: Self-pay | Admitting: *Deleted

## 2014-04-16 DIAGNOSIS — B379 Candidiasis, unspecified: Secondary | ICD-10-CM

## 2014-04-16 MED ORDER — FLUCONAZOLE 150 MG PO TABS: 150.0000 mg | ORAL_TABLET | Freq: Once | ORAL | Status: DC

## 2014-04-16 NOTE — Progress Notes (Signed)
See lab note.  

## 2014-04-20 ENCOUNTER — Telehealth: Payer: Self-pay | Admitting: Endocrinology

## 2014-04-20 ENCOUNTER — Emergency Department (HOSPITAL_COMMUNITY)
Admission: EM | Admit: 2014-04-20 | Discharge: 2014-04-20 | Disposition: A | Discharge status: Home / Self Care | Payer: Medicaid Other | Source: Home / Self Care

## 2014-04-20 ENCOUNTER — Encounter (HOSPITAL_COMMUNITY): Payer: Self-pay | Admitting: Emergency Medicine

## 2014-04-20 DIAGNOSIS — S39012A Strain of muscle, fascia and tendon of lower back, initial encounter: Secondary | ICD-10-CM

## 2014-04-20 MED ORDER — TRAMADOL HCL 50 MG PO TABS: 50.0000 mg | ORAL_TABLET | Freq: Four times a day (QID) | ORAL | Status: DC | PRN

## 2014-04-20 MED ORDER — DICLOFENAC SODIUM 1 % TD GEL: 1.0000 "application " | Freq: Four times a day (QID) | TRANSDERMAL | Status: DC

## 2014-04-20 MED ORDER — KETOROLAC TROMETHAMINE 60 MG/2ML IM SOLN
INTRAMUSCULAR | Status: AC
  Filled 2014-04-20: qty 2

## 2014-04-20 MED ORDER — KETOROLAC TROMETHAMINE 60 MG/2ML IM SOLN
60.0000 mg | Freq: Once | INTRAMUSCULAR | Status: AC
  Administered 2014-04-20: 60 mg via INTRAMUSCULAR

## 2014-04-20 NOTE — Discharge Instructions (Signed)
Back Pain, Adult °Low back pain is very common. About 1 in 5 people have back pain. The cause of low back pain is rarely dangerous. The pain often gets better over time. About half of people with a sudden onset of back pain feel better in just 2 weeks. About 8 in 10 people feel better by 6 weeks.  °CAUSES °Some common causes of back pain include: °· Strain of the muscles or ligaments supporting the spine. °· Wear and tear (degeneration) of the spinal discs. °· Arthritis. °· Direct injury to the back. °DIAGNOSIS °Most of the time, the direct cause of low back pain is not known. However, back pain can be treated effectively even when the exact cause of the pain is unknown. Answering your caregiver's questions about your overall health and symptoms is one of the most accurate ways to make sure the cause of your pain is not dangerous. If your caregiver needs more information, he or she may order lab work or imaging tests (X-rays or MRIs). However, even if imaging tests show changes in your back, this usually does not require surgery. °HOME CARE INSTRUCTIONS °For many people, back pain returns. Since low back pain is rarely dangerous, it is often a condition that people can learn to manage on their own.  °· Remain active. It is stressful on the back to sit or stand in one place. Do not sit, drive, or stand in one place for more than 30 minutes at a time. Take short walks on level surfaces as soon as pain allows. Try to increase the length of time you walk each day. °· Do not stay in bed. Resting more than 1 or 2 days can delay your recovery. °· Do not avoid exercise or work. Your body is made to move. It is not dangerous to be active, even though your back may hurt. Your back will likely heal faster if you return to being active before your pain is gone. °· Pay attention to your body when you  bend and lift. Many people have less discomfort when lifting if they bend their knees, keep the load close to their bodies, and  avoid twisting. Often, the most comfortable positions are those that put less stress on your recovering back. °· Find a comfortable position to sleep. Use a firm mattress and lie on your side with your knees slightly bent. If you lie on your back, put a pillow under your knees. °· Only take over-the-counter or prescription medicines as directed by your caregiver. Over-the-counter medicines to reduce pain and inflammation are often the most helpful. Your caregiver may prescribe muscle relaxant drugs. These medicines help dull your pain so you can more quickly return to your normal activities and healthy exercise. °· Put ice on the injured area. °¨ Put ice in a plastic bag. °¨ Place a towel between your skin and the bag. °¨ Leave the ice on for 15-20 minutes, 03-04 times a day for the first 2 to 3 days. After that, ice and heat may be alternated to reduce pain and spasms. °· Ask your caregiver about trying back exercises and gentle massage. This may be of some benefit. °· Avoid feeling anxious or stressed. Stress increases muscle tension and can worsen back pain. It is important to recognize when you are anxious or stressed and learn ways to manage it. Exercise is a great option. °SEEK MEDICAL CARE IF: °· You have pain that is not relieved with rest or medicine. °· You have pain that does not improve in 1 week. °· You have new symptoms. °· You are generally not feeling well. °SEEK   IMMEDIATE MEDICAL CARE IF:  °· You have pain that radiates from your back into your legs. °· You develop new bowel or bladder control problems. °· You have unusual weakness or numbness in your arms or legs. °· You develop nausea or vomiting. °· You develop abdominal pain. °· You feel faint. °Document Released: 07/02/2005 Document Revised: 01/01/2012 Document Reviewed: 11/03/2013 °ExitCare® Patient Information ©2015 [employer]. This information is not intended to replace advice given to you by your health care provider. Make sure you  discuss any questions you have with your health care provider. ° °Lumbosacral Strain °Lumbosacral strain is a strain of any of the parts that make up your lumbosacral vertebrae. Your lumbosacral vertebrae are the bones that make up the lower third of your backbone. Your lumbosacral vertebrae are held together by muscles and tough, fibrous tissue (ligaments).  °CAUSES  °A sudden blow to your back can cause lumbosacral strain. Also, anything that causes an excessive stretch of the muscles in the low back can cause this strain. This is typically seen when people exert themselves strenuously, fall, lift heavy objects, bend, or crouch repeatedly. °RISK FACTORS °· Physically demanding work. °· Participation in pushing or pulling sports or sports that require a sudden twist of the back (tennis, golf, baseball). °· Weight lifting. °· Excessive lower back curvature. °· Forward-tilted pelvis. °· Weak back or abdominal muscles or both. °· Tight hamstrings. °SIGNS AND SYMPTOMS  °Lumbosacral strain may cause pain in the area of your injury or pain that moves (radiates) down your leg.  °DIAGNOSIS °Your health care provider can often diagnose lumbosacral strain through a physical exam. In some cases, you may need tests such as X-ray exams.  °TREATMENT  °Treatment for your lower back injury depends on many factors that your clinician will have to evaluate. However, most treatment will include the use of anti-inflammatory medicines. °HOME CARE INSTRUCTIONS  °· Avoid hard physical activities (tennis, racquetball, waterskiing) if you are not in proper physical condition for it. This may aggravate or create problems. °· If you have a back problem, avoid sports requiring sudden body movements. Swimming and walking are generally safer activities. °· Maintain good posture. °· Maintain a healthy weight. °· For acute conditions, you may put ice on the injured area. °¨ Put ice in a plastic bag. °¨ Place a towel between your skin and the  bag. °¨ Leave the ice on for 20 minutes, 2-3 times a day. °· When the low back starts healing, stretching and strengthening exercises may be recommended. °SEEK MEDICAL CARE IF: °· Your back pain is getting worse. °· You experience severe back pain not relieved with medicines. °SEEK IMMEDIATE MEDICAL CARE IF:  °· You have numbness, tingling, weakness, or problems with the use of your arms or legs. °· There is a change in bowel or bladder control. °· You have increasing pain in any area of the body, including your belly (abdomen). °· You notice shortness of breath, dizziness, or feel faint. °· You feel sick to your stomach (nauseous), are throwing up (vomiting), or become sweaty. °· You notice discoloration of your toes or legs, or your feet get very cold. °MAKE SURE YOU:  °· Understand these instructions. °· Will watch your condition. °· Will get help right away if you are not doing well or get worse. °Document Released: 04/11/2005 Document Revised: 07/07/2013 Document Reviewed: 02/18/2013 °ExitCare® Patient Information ©2015 [employer]. This information is not intended to replace advice given to you by your health care provider.   Make sure you discuss any questions you have with your health care provider. ° °

## 2014-04-20 NOTE — ED Provider Notes (Signed)
CSN: [id number]     Arrival date & time 04/20/14  1849 History   First MD Initiated Contact with Patient 04/20/14 1928     No chief complaint on file.  (Consider location/radiation/quality/duration/timing/severity/associated sxs/prior Treatment) HPI Comments: Lifted heavy suitcase yesterday and experienced pain lower mid back. Worse with Spinal flexion, rotation, leaning forward.  No radicular pain, distal paresthesia or weakness. No fall or blunt trauma   Past Medical History  Diagnosis Date  . Pituitary tumor   . AMA (advanced maternal age) multigravida 77+   . Depression     post partum   Past Surgical History  Procedure Laterality Date  . None     Family History  Problem Relation Age of Onset  . Anemia Cousin     patient's first cousin once removed; parents of cousin are first cousins to each other and to the patient  . Diabetes Maternal Grandmother   . Thyroid disease Maternal Aunt    History  Substance Use Topics  . Smoking status: Never Smoker   . Smokeless tobacco: Never Used  . Alcohol Use: No   OB History   Grav Para Term Preterm Abortions TAB SAB Ect Mult Living   5 3 3  0 2 0 2 0 0 3     Review of Systems  Constitutional: Negative for fever, chills and activity change.  Respiratory: Negative.   Cardiovascular: Negative.   Musculoskeletal: Positive for back pain.       As per HPI  Skin: Negative for color change and rash.  Neurological: Negative.     Allergies  Review of patient's allergies indicates no known allergies.  Home Medications   Prior to Admission medications   Medication Sig Start Date End Date Taking? Authorizing Provider  cabergoline (DOSTINEX) 0.5 MG tablet Take 1 tablet (0.5 mg total) by mouth 2 (two) times a week. 08/25/13   Elayne Snare, MD  cyclobenzaprine (FLEXERIL) 10 MG tablet Take 1 tablet (10 mg total) by mouth 3 (three) times daily as needed for muscle spasms. 03/01/14   Lorayne Marek, MD  diclofenac (VOLTAREN) 50 MG EC tablet  Take 1 tablet (50 mg total) by mouth daily as needed. 01/05/14   Marcial Pacas, MD  diclofenac sodium (VOLTAREN) 1 % GEL Apply 1 application topically 4 (four) times daily. 04/20/14   Janne Napoleon, NP  fluconazole (DIFLUCAN) 150 MG tablet Take 1 tablet (150 mg total) by mouth once. 04/16/14   Lahoma Crocker, MD  ibuprofen (ADVIL,MOTRIN) 600 MG tablet Take 1 tablet (600 mg total) by mouth every 8 (eight) hours as needed. 03/01/14   Lorayne Marek, MD  Norethindrone Acetate-Ethinyl Estrad-FE (LOESTRIN 24 FE) 1-20 MG-MCG(24) tablet Take 1 tablet by mouth daily. 04/09/14   Lahoma Crocker, MD  nortriptyline (PAMELOR) 10 MG capsule Take 30 mg by mouth at bedtime. 12/22/13   Marcial Pacas, MD  Prenatal Vit-DSS-Fe Fum-FA (SE-NATAL 19) 29-1 MG TABS Take 1 tablet by mouth daily. 11/16/13   Lahoma Crocker, MD  Prenatal Vit-Fe Fumarate-FA (PREPLUS) 27-1 MG TABS TAKE 1 TABLET BY MOUTH EVERY DAY    Lahoma Crocker, MD  traMADol (ULTRAM) 50 MG tablet Take 1 tablet (50 mg total) by mouth every 6 (six) hours as needed. 04/20/14   Janne Napoleon, NP   BP 109/78  Pulse 90  Temp(Src) 98.5 F (36.9 C) (Oral)  Resp 12  SpO2 100% Physical Exam  Nursing note and vitals reviewed. Constitutional: She is oriented to person, place, and time. She appears well-developed and well-nourished. No  distress.  HENT:  Head: Normocephalic and atraumatic.  Eyes: EOM are normal.  Neck: Normal range of motion. Neck supple.  Musculoskeletal:  Tenderness across lower paralumbar musculature. No spinal deformity. Lower extremity strength is 5/5 and symmetric.  Neurological: She is alert and oriented to person, place, and time. No cranial nerve deficit.  Skin: Skin is warm and dry.  Psychiatric: She has a normal mood and affect.    ED Course  Procedures (including critical care time) Labs Review Labs Reviewed - No data to display  Imaging Review No results found.   MDM   1. Strain of lumbar paraspinal muscle, initial encounter     toradol 60 mg IM Diclofenac gel Heat to back Body mechanics discussed Continue flexeril as needed    Janne Napoleon, NP 04/20/14 1948

## 2014-04-20 NOTE — Telephone Encounter (Signed)
Pt would like her lab results form the 04/13/14

## 2014-04-20 NOTE — ED Notes (Signed)
C/o low back pain onset yesterday afternoon.  No known injury.  Started after lifting a 50 lb. Suitcase.

## 2014-04-20 NOTE — Telephone Encounter (Signed)
Patient needs to be seen.

## 2014-04-21 ENCOUNTER — Telehealth (HOSPITAL_COMMUNITY): Payer: Self-pay | Admitting: *Deleted

## 2014-04-21 NOTE — ED Notes (Signed)
[facility] pharmacy called and asked if it is 2 gm or 4 gm on each dose on the Voltaren gel. Discussed with Dr. Georgina Snell.  He said 4 gm. QID.  Pharmacist notified. 04/21/2014

## 2014-04-22 NOTE — ED Provider Notes (Signed)
Medical screening examination/treatment/procedure(s) were performed by resident physician or non-physician practitioner and as supervising physician I was immediately available for consultation/collaboration.   Pauline Good MD.   Billy Fischer, MD 04/22/14 [phone]

## 2014-04-22 NOTE — Telephone Encounter (Signed)
She is coming in on 10/27 for office visit, needs to come in 3-4 days before for her labs

## 2014-04-23 NOTE — Telephone Encounter (Signed)
Please schedule for labs

## 2014-04-25 ENCOUNTER — Emergency Department (HOSPITAL_COMMUNITY): Payer: Medicaid Other

## 2014-04-25 ENCOUNTER — Emergency Department (HOSPITAL_COMMUNITY)
Admission: EM | Admit: 2014-04-25 | Discharge: 2014-04-25 | Disposition: A | Discharge status: Home / Self Care | Payer: Medicaid Other | Attending: Emergency Medicine | Admitting: Emergency Medicine

## 2014-04-25 ENCOUNTER — Encounter (HOSPITAL_COMMUNITY): Payer: Self-pay | Admitting: Emergency Medicine

## 2014-04-25 DIAGNOSIS — Z791 Long term (current) use of non-steroidal anti-inflammatories (NSAID): Secondary | ICD-10-CM | POA: Insufficient documentation

## 2014-04-25 DIAGNOSIS — M5441 Lumbago with sciatica, right side: Secondary | ICD-10-CM | POA: Diagnosis not present

## 2014-04-25 DIAGNOSIS — Z8639 Personal history of other endocrine, nutritional and metabolic disease: Secondary | ICD-10-CM | POA: Insufficient documentation

## 2014-04-25 DIAGNOSIS — Z8659 Personal history of other mental and behavioral disorders: Secondary | ICD-10-CM | POA: Insufficient documentation

## 2014-04-25 DIAGNOSIS — Z79899 Other long term (current) drug therapy: Secondary | ICD-10-CM | POA: Insufficient documentation

## 2014-04-25 DIAGNOSIS — M545 Low back pain: Secondary | ICD-10-CM | POA: Diagnosis present

## 2014-04-25 MED ORDER — HYDROCODONE-ACETAMINOPHEN 5-325 MG PO TABS: 1.0000 | ORAL_TABLET | ORAL | Status: DC | PRN

## 2014-04-25 MED ORDER — PREDNISONE 10 MG PO TABS: 20.0000 mg | ORAL_TABLET | Freq: Every day | ORAL | Status: DC

## 2014-04-25 MED ORDER — CYCLOBENZAPRINE HCL 10 MG PO TABS: 10.0000 mg | ORAL_TABLET | Freq: Two times a day (BID) | ORAL | Status: DC | PRN

## 2014-04-25 NOTE — ED Notes (Signed)
PT wants an x-ray for back pain . Pt was last seen at [facility] and is now taking Ultram with out relikef of back pain.

## 2014-04-25 NOTE — ED Notes (Signed)
Pt. Stated, i lifted a 50 lb suitcase and i hurt my back.  i went to [facility] and they said it was a pulled muscle.

## 2014-04-25 NOTE — ED Notes (Signed)
Declined W/C at D/C and was escorted to lobby by RN. 

## 2014-04-25 NOTE — ED Provider Notes (Signed)
CSN: [id number]     Arrival date & time 04/25/14  1044 History  This chart was scribed for non-physician practitioner Shelley Haring, PA-C working with Shelley Essex, MD by Shelley Hardy, ED Scribe. This patient was seen in [facility] and the patient's care was started at 11:17 AM.     Chief Complaint  Patient presents with  . Back Pain   The history is provided by the patient.   HPI Comments: Shelley Hardy is a 38 y.o. female who presents to the Emergency Department complaining of lower right and central back pain that radiates to her right leg. She has numbness without weakness as an associated symptom. She states the acquired the pain when she lifted a 50 lb suitcase. Pt notes she went urgent care 3 days ago and they gave her Voltaren cream, Tramadol which she notes gave her no relief. Her pain  worsens with movment, laying and sitting down. Pt notes 7 years ago she had back pain but it wasn't similar to this complaint. She denies loss of bowel movment, weakness, and dizziness.   Past Medical History  Diagnosis Date  . Pituitary tumor   . AMA (advanced maternal age) multigravida 67+   . Depression     post partum   Past Surgical History  Procedure Laterality Date  . None     Family History  Problem Relation Age of Onset  . Anemia Cousin     patient's first cousin once removed; parents of cousin are first cousins to each other and to the patient  . Diabetes Maternal Grandmother   . Thyroid disease Maternal Aunt    History  Substance Use Topics  . Smoking status: Never Smoker   . Smokeless tobacco: Never Used  . Alcohol Use: No   OB History   Grav Para Term Preterm Abortions TAB SAB Ect Mult Living   5 3 3  0 2 0 2 0 0 3     Review of Systems  Gastrointestinal:       Denies any loss in BM  Musculoskeletal: Positive for back pain.  Neurological: Positive for numbness. Negative for dizziness.  All other systems reviewed and are negative.     Allergies   Review of patient's allergies indicates no known allergies.  Home Medications   Prior to Admission medications   Medication Sig Start Date End Date Taking? Authorizing Provider  cabergoline (DOSTINEX) 0.5 MG tablet Take 1 tablet (0.5 mg total) by mouth 2 (two) times a week. 08/25/13   Elayne Snare, MD  cyclobenzaprine (FLEXERIL) 10 MG tablet Take 1 tablet (10 mg total) by mouth 3 (three) times daily as needed for muscle spasms. 03/01/14   Lorayne Marek, MD  diclofenac (VOLTAREN) 50 MG EC tablet Take 1 tablet (50 mg total) by mouth daily as needed. 01/05/14   Marcial Pacas, MD  diclofenac sodium (VOLTAREN) 1 % GEL Apply 1 application topically 4 (four) times daily. 04/20/14   Janne Napoleon, NP  fluconazole (DIFLUCAN) 150 MG tablet Take 1 tablet (150 mg total) by mouth once. 04/16/14   Lahoma Crocker, MD  ibuprofen (ADVIL,MOTRIN) 600 MG tablet Take 1 tablet (600 mg total) by mouth every 8 (eight) hours as needed. 03/01/14   Lorayne Marek, MD  Norethindrone Acetate-Ethinyl Estrad-FE (LOESTRIN 24 FE) 1-20 MG-MCG(24) tablet Take 1 tablet by mouth daily. 04/09/14   Lahoma Crocker, MD  nortriptyline (PAMELOR) 10 MG capsule Take 30 mg by mouth at bedtime. 12/22/13   Marcial Pacas, MD  Prenatal Vit-DSS-Fe Fum-FA (  SE-NATAL 19) 29-1 MG TABS Take 1 tablet by mouth daily. 11/16/13   Lahoma Crocker, MD  Prenatal Vit-Fe Fumarate-FA (PREPLUS) 27-1 MG TABS TAKE 1 TABLET BY MOUTH EVERY DAY    Lahoma Crocker, MD  traMADol (ULTRAM) 50 MG tablet Take 1 tablet (50 mg total) by mouth every 6 (six) hours as needed. 04/20/14   Janne Napoleon, NP   BP 115/73  Pulse 81  Temp(Src) 97.2 F (36.2 C) (Oral)  Resp 16  Ht 5\' 6"  (1.676 m)  Wt 173 lb (78.472 kg)  BMI 27.94 kg/m2  SpO2 96%  LMP 04/20/2014 Physical Exam  Nursing note and vitals reviewed. Constitutional: She is oriented to person, place, and time. She appears well-developed and well-nourished. No distress.  HENT:  Head: Normocephalic and atraumatic.  Eyes: EOM are  normal.  Neck: Normal range of motion.  Cardiovascular: Normal rate.   Pulmonary/Chest: Effort normal.  Musculoskeletal:  Pt has equal strength to bilateral lower extremities.  Neurosensory function adequate to both legs Skin color is normal. Skin is warm and moist.  I see no step off deformity, no midline bony tenderness.  Pt is able to ambulate but with a limp  No crepitus, laceration, effusion, induration, lesions, swelling.   Pedal pulses are symmetrical and palpable bilaterally  mild tenderness to palpation of paraspinel muscles    Neurological: She is alert and oriented to person, place, and time.  Normal strength of upper and lower extremities   Skin: Skin is warm and dry.  Psychiatric: She has a normal mood and affect. Her behavior is normal.    ED Course  Procedures  DIAGNOSTIC STUDIES: Oxygen Saturation is 96% on room air, adequate by my interpretation.    COORDINATION OF CARE: 11:23 AM-Discussed treatment plan with pt at bedside and pt agreed to plan.   Labs Review Labs Reviewed - No data to display  Imaging Review Dg Lumbar Spine Complete  04/25/2014   CLINICAL DATA:  Low back pain for 1 week.  EXAM: LUMBAR SPINE - COMPLETE 4+ VIEW  COMPARISON:  None.  FINDINGS: There is no evidence of lumbar spine fracture. Alignment is normal. Intervertebral disc spaces are maintained. Incidental note is made of a transitional lumbosacral vertebra at S1.  IMPRESSION: Negative lumbar spine radiographs.   Electronically Signed   By: Earle Gell M.D.   On: 04/25/2014 13:04     EKG Interpretation None      MDM   Final diagnoses:  Right-sided low back pain with right-sided sciatica   38 y.o.[name]'s  with back pain. No neurological deficits and normal neuro exam. Patient can walk. No loss of bowel or bladder control. No concern for cauda equina at this time base on HPI and physical exam findings. No fever, night sweats, weight loss, h/o cancer, IVDU.   RICE  protocol and pain medicine indicated and discussed with patient.   Patient Plan 1. Medications: pain medication and muscle relaxer. Cont usual home medications unless otherwise directed. 2. Treatment: rest, drink plenty of fluids, gentle stretching as discussed, alternate ice and heat  3. Follow Up: Please followup with your primary doctor for discussion of your diagnoses and further evaluation after today's visit; if you do not have a primary care doctor use the resource guide provided to find one   Vital signs are stable at discharge. Filed Vitals:   04/25/14 1056  BP: 115/73  Pulse: 81  Temp: 97.2 F (36.2 C)  Resp: 16    Patient/guardian has voiced  understanding and agreed to follow-up with the PCP or specialist.        Linus Mako, PA-C 04/25/14 1321

## 2014-04-25 NOTE — ED Provider Notes (Signed)
Medical screening examination/treatment/procedure(s) were performed by non-physician practitioner and as supervising physician I was immediately available for consultation/collaboration.   EKG Interpretation None        Ezequiel Essex, MD 04/25/14 1806

## 2014-04-25 NOTE — Discharge Instructions (Signed)
Back Pain, Adult °Back pain is very common. The pain often gets better over time. The cause of back pain is usually not dangerous. Most people can learn to manage their back pain on their own.  °HOME CARE  °· Stay active. Start with short walks on flat ground if you can. Try to walk farther each day. °· Do not sit, drive, or stand in one place for more than 30 minutes. Do not stay in bed. °· Do not avoid exercise or work. Activity can help your back heal faster. °· Be careful when you bend or lift an object. Bend at your knees, keep the object close to you, and do not twist. °· Sleep on a firm mattress. Lie on your side, and bend your knees. If you lie on your back, put a pillow under your knees. °· Only take medicines as told by your doctor. °· Put ice on the injured area. °¨ Put ice in a plastic bag. °¨ Place a towel between your skin and the bag. °¨ Leave the ice on for 15-20 minutes, 03-04 times a day for the first 2 to 3 days. After that, you can switch between ice and heat packs. °· Ask your doctor about back exercises or massage. °· Avoid feeling anxious or stressed. Find good ways to deal with stress, such as exercise. °GET HELP RIGHT AWAY IF:  °· Your pain does not go away with rest or medicine. °· Your pain does not go away in 1 week. °· You have new problems. °· You do not feel well. °· The pain spreads into your legs. °· You cannot control when you poop (bowel movement) or pee (urinate). °· Your arms or legs feel weak or lose feeling (numbness). °· You feel sick to your stomach (nauseous) or throw up (vomit). °· You have belly (abdominal) pain. °· You feel like you may pass out (faint). °MAKE SURE YOU:  °· Understand these instructions. °· Will watch your condition. °· Will get help right away if you are not doing well or get worse. °Document Released: 12/19/2007 Document Revised: 09/24/2011 Document Reviewed: 11/03/2013 °ExitCare® Patient Information ©2015 [employer]. This information is not intended  to replace advice given to you by your health care provider. Make sure you discuss any questions you have with your health care provider. ° °

## 2014-04-27 NOTE — Telephone Encounter (Signed)
Please see below.

## 2014-04-28 ENCOUNTER — Ambulatory Visit: Payer: Medicaid Other | Admitting: Endocrinology

## 2014-05-06 ENCOUNTER — Other Ambulatory Visit: Payer: Medicaid Other

## 2014-05-07 ENCOUNTER — Other Ambulatory Visit: Payer: Medicaid Other

## 2014-05-07 DIAGNOSIS — D352 Benign neoplasm of pituitary gland: Secondary | ICD-10-CM

## 2014-05-08 LAB — PROLACTIN: Prolactin: 4.5 ng/mL

## 2014-05-11 ENCOUNTER — Ambulatory Visit (INDEPENDENT_AMBULATORY_CARE_PROVIDER_SITE_OTHER): Payer: Medicaid Other | Admitting: Endocrinology

## 2014-05-11 ENCOUNTER — Encounter: Payer: Self-pay | Admitting: Endocrinology

## 2014-05-11 VITALS — BP 112/70 | HR 92 | Temp 97.8°F | Resp 14 | Ht 66.0 in | Wt 173.8 lb

## 2014-05-11 DIAGNOSIS — D352 Benign neoplasm of pituitary gland: Secondary | ICD-10-CM

## 2014-05-11 NOTE — Progress Notes (Signed)
Patient ID: Shelley Hardy, female   DOB: 12/17/1975, 38 y.o.   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 Details of this evaluation are not available Not clear when her first MRI scan was done, reports available only for the scans in 2011 in 2012 In 2012 MRI pituitary scan showed a 9 x 6 x 13 mm post contrast hyperintense mass anteriorly within the pituitary. This was not significantly changed in size with prior exam. She also had some milk discharge from her breasts.    She was treated with bromocriptine by her gynecologist about 3 years ago which he took this for only 4 months.  Her menstrual cycles were normal with this. She had a pregnancy in 2013 and subsequently had amenorrhea but was breast-feeding.  PROLACTIN level done in 07/2013 was 190  Recent history: She was started on Dostinex on her initial consultation in 2/15, initially given half tablet twice a week and then 1 tablet twice a week She has however not followed up until now She stopped breast-feeding in August and has had a normal menstrual cycle starting on 10/60/15 She has had no side effects with taking Dostinex and her prolactin level is back to normal  Appointment on 05/07/2014  Component Date Value Ref Range Status  . Prolactin 05/07/2014 4.5   Final   Comment:      Reference Ranges:                                           Female:                       2.1 -  17.1 ng/ml                                           Female:   Pregnant          9.7 - 208.5 ng/mL                                                     Non Pregnant      2.8 -  29.2 ng/mL                                                     Post Menopausal   1.8 -  20.3 ng/mL                                                 Past Medical History   Diagnosis Date  . Pituitary tumor   . AMA (advanced maternal age) multigravida 56+   . Depression  post partum    Past Surgical History  Procedure Laterality Date  . None      Family History  Problem Relation Age of Onset  . Anemia Cousin     patient's first cousin once removed; parents of cousin are first cousins to each other and to the patient  . Diabetes Maternal Grandmother   . Thyroid disease Maternal Aunt     Social History:  reports that she has never smoked. She has never used smokeless tobacco. She reports that she does not drink alcohol or use illicit drugs.  Allergies: No Known Allergies    Medication List       This list is accurate as of: 05/11/14 10:35 AM.  Always use your most recent med list.               cabergoline 0.5 MG tablet  Commonly known as:  DOSTINEX  Take 1 tablet (0.5 mg total) by mouth 2 (two) times a week.     cyclobenzaprine 10 MG tablet  Commonly known as:  FLEXERIL  Take 1 tablet (10 mg total) by mouth 2 (two) times daily as needed for muscle spasms.     HYDROcodone-acetaminophen 5-325 MG per tablet  Commonly known as:  NORCO/VICODIN  Take 1-2 tablets by mouth every 4 (four) hours as needed.     ibuprofen 600 MG tablet  Commonly known as:  ADVIL,MOTRIN  Take 1 tablet (600 mg total) by mouth every 8 (eight) hours as needed.     Norethindrone Acetate-Ethinyl Estrad-FE 1-20 MG-MCG(24) tablet  Commonly known as:  LOESTRIN 24 FE  Take 1 tablet by mouth daily.     nortriptyline 10 MG capsule  Commonly known as:  PAMELOR  Take 30 mg by mouth at bedtime.     PREPLUS 27-1 MG Tabs  TAKE 1 TABLET BY MOUTH EVERY DAY     SE-NATAL 19 29-1 MG Tabs  Take 1 tablet by mouth daily.     traMADol 50 MG tablet  Commonly known as:  ULTRAM  Take 1 tablet (50 mg total) by mouth every 6 (six) hours as needed.          REVIEW OF SYSTEMS:            GOITER  She was found to have a thyroid enlargement over 10 years ago. She has  had periodic evaluations of this and thyroid functions have been consistently normal. She feels that the goiter has been gradually increasing in size and is now much larger than initially. She does not feel any choking sensation but does at times feel pressure in the neck especially when lying down in certain positions. Sometimes she feels some bubbles in her throat. No difficulty with swallowing or breathing. No local discomfort. Thyroid scan report from 2004 was as follows: Low-normal thyroid uptake AT 12.8%. Impression: ABNORMAL THYROID SCAN, POSSIBLY DEMONSTRATING A HOT NODULE WITHIN THE RIGHT THYROID LOBE AND A COLD NODULE IN THE LEFT THYROID GLAND. RECOMMEND CORRELATION WITH ULTRASOUND OF THE THYROID GLAND. January 2015 ultrasound shows no distinct nodule but diffuse enlargement, left lobe larger measuring 9.2 x 6.7 x 5.1 cm.    PHYSICAL EXAM:  BP 112/70  Pulse 92  Temp(Src) 97.8 F (36.6 C)  Resp 14  Ht 5\' 6"  (1.676 m)  Wt 173 lb 12.8 oz (78.835 kg)  BMI 28.07 kg/m2  SpO2 98%  LMP 04/20/2014   ASSESSMENT:   Prolactinoma presenting as amenorrhea and galactorrhea and baseline prolactin of 190 Difficult to  assess her symptoms since she was breast-feeding until recently and has now started having her menses only this month Prolactin level is low normal now at 4.5   PLAN:   She will reduce her Dostinex by half a tablet weekly and will take 1 tablet on Monday and half on Thursdays She will need to have a prolactin done again in about 4 months Emphasized the need for regular follow-up and long-term treatment Also explained to her that she needs to use contraception to prevent pregnancy since she is now ovulating  Taichi Repka 05/11/2014, 10:35 AM

## 2014-05-11 NOTE — Patient Instructions (Signed)
Take 1 on Monday and 1/2 on Thursdays

## 2014-05-17 ENCOUNTER — Encounter: Payer: Self-pay | Admitting: Endocrinology

## 2014-06-02 ENCOUNTER — Ambulatory Visit: Payer: Medicaid Other | Admitting: Nurse Practitioner

## 2014-06-02 ENCOUNTER — Telehealth: Payer: Self-pay | Admitting: Nurse Practitioner

## 2014-06-02 NOTE — Telephone Encounter (Signed)
No show for scheduled appointment 

## 2014-06-14 ENCOUNTER — Encounter: Payer: Self-pay | Admitting: Nurse Practitioner

## 2014-07-02 ENCOUNTER — Other Ambulatory Visit: Payer: Self-pay | Admitting: Endocrinology

## 2014-07-12 ENCOUNTER — Ambulatory Visit: Payer: Medicaid Other | Admitting: Obstetrics & Gynecology

## 2014-07-12 ENCOUNTER — Encounter: Payer: Self-pay | Admitting: *Deleted

## 2014-07-13 ENCOUNTER — Encounter: Payer: Self-pay | Admitting: Obstetrics & Gynecology

## 2014-09-08 ENCOUNTER — Other Ambulatory Visit: Payer: Medicaid Other

## 2014-09-09 ENCOUNTER — Other Ambulatory Visit: Payer: Medicaid Other

## 2014-09-09 DIAGNOSIS — D352 Benign neoplasm of pituitary gland: Secondary | ICD-10-CM

## 2014-09-10 LAB — PROLACTIN: Prolactin: 25.3 ng/mL

## 2014-09-13 ENCOUNTER — Encounter: Payer: Self-pay | Admitting: Endocrinology

## 2014-09-13 ENCOUNTER — Ambulatory Visit (INDEPENDENT_AMBULATORY_CARE_PROVIDER_SITE_OTHER): Payer: Medicaid Other | Admitting: Endocrinology

## 2014-09-13 VITALS — BP 100/80 | HR 70 | Temp 99.0°F | Ht 66.0 in | Wt 170.0 lb

## 2014-09-13 DIAGNOSIS — E042 Nontoxic multinodular goiter: Secondary | ICD-10-CM

## 2014-09-13 DIAGNOSIS — D352 Benign neoplasm of pituitary gland: Secondary | ICD-10-CM

## 2014-09-13 NOTE — Patient Instructions (Signed)
Extra 1/2 cabergoline  on Thursdays

## 2014-09-13 NOTE — Progress Notes (Signed)
Patient ID: Shelley Hardy, female   DOB: [birth year], 39 y.o.   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 Details of this evaluation are not available Not clear when her first MRI scan was done, reports available only for the scans in 2011 in 2012 In 2012 MRI pituitary scan showed a 9 x 6 x 13 mm post contrast hyperintense mass anteriorly within the pituitary. This was not significantly changed in size with prior exam. She also had some milk discharge from her breasts.    She was treated with bromocriptine by her gynecologist about 3 years ago which he took this for only 4 months.  Her menstrual cycles were normal with this. She had a pregnancy in 2013 and subsequently had amenorrhea but was breast-feeding.  PROLACTIN level done in 07/2013 was 190  Recent history: She was started on Dostinex on her initial consultation in 2/15, initially given half tablet twice a week and then 1 tablet twice a week She has had a normal menstrual cycle starting on 10/60/15 She has had no side effects with taking Dostinex and her prolactin level was low normal on her last visit in 10/15. She was told to take 1 tablet on Mondays and half on Thursdays but on her own she is taking only 1 tablet a week  Although she still has regular menstrual cycles her prolactin level is mildly increased No symptoms of galactorrhea or headaches     Appointment on 09/09/2014  Component Date Value Ref Range Status  . Prolactin 09/09/2014 25.3   Final   Comment:      Reference Ranges:                  Female:                       2.1 -  17.1 ng/ml                  Female:   Pregnant          9.7 - 208.5 ng/mL                            Non Pregnant      2.8 -  29.2  ng/mL                            Post Menopausal   1.8 -  20.3 ng/mL                         PROBLEM 2:  GOITER, multinodular  She was found to have a thyroid enlargement over 10 years ago. She has had periodic evaluations of  and thyroid functions have been  normal. She feels that the goiter has been gradually increasing in size  She does feel some choking sensation during the night in certain positions and may have to cough.  She may also feel pressure in her neck at times.  No difficulty with swallowing or breathing. No local discomfort.  Thyroid scan report from 2004 was as follows: Low-normal thyroid uptake AT 12.8%. Impression: ABNORMAL THYROID SCAN,  POSSIBLY DEMONSTRATING A HOT NODULE WITHIN THE RIGHT THYROID LOBE AND A COLD NODULE IN THE LEFT THYROID GLAND.  January 2015 ultrasound shows no distinct nodule but diffuse enlargement, left lobe larger measuring 9.2 x 6.7 x 5.1 cm.  Currently she does not feel like she wants to go have surgery  Lab Results  Component Value Date   TSH 0.452 03/01/2014   TSH 0.489 08/07/2013   TSH 0.659 02/15/2009   FREET4 0.97 08/07/2013   FREET4 0.99 08/13/2007     Past Medical History  Diagnosis Date  . Pituitary tumor   . AMA (advanced maternal age) multigravida 59+   . Depression     post partum    Past Surgical History  Procedure Laterality Date  . None      Family History  Problem Relation Age of Onset  . Anemia Cousin     patient's first cousin once removed; parents of cousin are first cousins to each other and to the patient  . Diabetes Maternal Grandmother   . Thyroid disease Maternal Aunt     Social History:  reports that she has never smoked. She has never used smokeless tobacco. She reports that she does not drink alcohol or use illicit drugs.  Allergies: No Known Allergies    Medication List       This list is accurate as of: 09/13/14  1:07 PM.  Always use your most recent med list.                cabergoline 0.5 MG tablet  Commonly known as:  DOSTINEX  TAKE 1 TABLET BY MOUTH TWO TIMES A WEEK     cyclobenzaprine 10 MG tablet  Commonly known as:  FLEXERIL  Take 1 tablet (10 mg total) by mouth 2 (two) times daily as needed for muscle spasms.     HYDROcodone-acetaminophen 5-325 MG per tablet  Commonly known as:  NORCO/VICODIN  Take 1-2 tablets by mouth every 4 (four) hours as needed.     ibuprofen 600 MG tablet  Commonly known as:  ADVIL,MOTRIN  Take 1 tablet (600 mg total) by mouth every 8 (eight) hours as needed.     Norethindrone Acetate-Ethinyl Estrad-FE 1-20 MG-MCG(24) tablet  Commonly known as:  LOESTRIN 24 FE  Take 1 tablet by mouth daily.     nortriptyline 10 MG capsule  Commonly known as:  PAMELOR  Take 30 mg by mouth at bedtime.     PREPLUS 27-1 MG Tabs  TAKE 1 TABLET BY MOUTH EVERY DAY     SE-NATAL 19 29-1 MG Tabs  Take 1 tablet by mouth daily.     traMADol 50 MG tablet  Commonly known as:  ULTRAM  Take 1 tablet (50 mg total) by mouth every 6 (six) hours as needed.          REVIEW OF SYSTEMS:           No history of hypertension or diabetes.  Neck circumference is 38.5-39 cm She has a very large multinodular goiter bilaterally areas of firm nodules.  Both lobes are about the same size She has no stridor or Pemberton sign No local tenderness No lymphadenopathy in the neck No pedal edema  PHYSICAL EXAM:  BP 100/80 mmHg  Pulse 70  Temp(Src) 99 F (37.2 C) (Oral)  Ht 5\' 6"  (1.676 m)  Wt 170 lb (77.111 kg)  BMI 27.45 kg/m2  SpO2 98%   ASSESSMENT:    Prolactinoma presenting as amenorrhea and galactorrhea and baseline  prolactin of 190. Although she is having regular menstrual cycles she is also taking a birth control pill currently  However her prolactin level is mildly increased at 25; she is taking less than the prescribed dose of 1-1/2 tablets a week off Dostinex.  GOITER: She has a large multinodular goiter which clinically is about the  same as previously She does tend to have some pressure symptoms especially in lying down positions Has not had any recent thyroid levels checked   PLAN:   She will add another half tablet weekly and will take 1 tablet on Monday and half on Thursdays She will need to have a prolactin done again in follow-up  She will have thyroid function tests also on the next visit Discussed that if she has continued pressure symptoms she may need to consider surgery especially since I-131 treatment may not work with her last uptake being low   Vaness Jelinski 09/13/2014, 1:07 PM

## 2014-11-09 ENCOUNTER — Telehealth: Payer: Self-pay | Admitting: Endocrinology

## 2014-11-09 NOTE — Telephone Encounter (Signed)
Stop Cabergoline  

## 2014-11-09 NOTE — Telephone Encounter (Signed)
I tried calling patient back but didn't get an answer, do you see anything in her list that she should not take while pregnant?

## 2014-11-09 NOTE — Telephone Encounter (Signed)
NA on call back

## 2014-11-09 NOTE — Telephone Encounter (Signed)
Patient stated that she took a pregnancy test at home and it was positive, she has questions about the medication she's taking, please advise

## 2014-11-10 NOTE — Telephone Encounter (Signed)
Message left on patients vm to stop cabergoline.

## 2014-11-16 ENCOUNTER — Other Ambulatory Visit: Payer: Self-pay | Admitting: Obstetrics

## 2014-11-16 ENCOUNTER — Ambulatory Visit (INDEPENDENT_AMBULATORY_CARE_PROVIDER_SITE_OTHER): Payer: Medicaid Other | Admitting: Certified Nurse Midwife

## 2014-11-16 VITALS — BP 113/71 | HR 89 | Wt 170.8 lb

## 2014-11-16 DIAGNOSIS — Z8639 Personal history of other endocrine, nutritional and metabolic disease: Secondary | ICD-10-CM | POA: Diagnosis not present

## 2014-11-16 DIAGNOSIS — O09521 Supervision of elderly multigravida, first trimester: Secondary | ICD-10-CM

## 2014-11-16 DIAGNOSIS — Z0000 Encounter for general adult medical examination without abnormal findings: Secondary | ICD-10-CM | POA: Diagnosis not present

## 2014-11-16 DIAGNOSIS — F329 Major depressive disorder, single episode, unspecified: Secondary | ICD-10-CM

## 2014-11-16 DIAGNOSIS — Z01419 Encounter for gynecological examination (general) (routine) without abnormal findings: Secondary | ICD-10-CM

## 2014-11-16 DIAGNOSIS — F32A Depression, unspecified: Secondary | ICD-10-CM

## 2014-11-16 DIAGNOSIS — Z87898 Personal history of other specified conditions: Secondary | ICD-10-CM

## 2014-11-16 DIAGNOSIS — Z3682 Encounter for antenatal screening for nuchal translucency: Secondary | ICD-10-CM

## 2014-11-16 LAB — TSH: TSH: 0.369 u[IU]/mL (ref 0.350–4.500)

## 2014-11-16 LAB — POCT URINE PREGNANCY: Preg Test, Ur: POSITIVE

## 2014-11-17 ENCOUNTER — Encounter: Payer: Self-pay | Admitting: Certified Nurse Midwife

## 2014-11-17 DIAGNOSIS — F32A Depression, unspecified: Secondary | ICD-10-CM | POA: Insufficient documentation

## 2014-11-17 DIAGNOSIS — F329 Major depressive disorder, single episode, unspecified: Secondary | ICD-10-CM | POA: Insufficient documentation

## 2014-11-17 DIAGNOSIS — Z87898 Personal history of other specified conditions: Secondary | ICD-10-CM | POA: Insufficient documentation

## 2014-11-17 LAB — OBSTETRIC PANEL
Antibody Screen: NEGATIVE
Basophils Absolute: 0 10*3/uL (ref 0.0–0.1)
Basophils Relative: 0 % (ref 0–1)
EOS ABS: 0.1 10*3/uL (ref 0.0–0.7)
EOS PCT: 2 % (ref 0–5)
HEMATOCRIT: 34 % — AB (ref 36.0–46.0)
HEMOGLOBIN: 11.3 g/dL — AB (ref 12.0–15.0)
Hepatitis B Surface Ag: NEGATIVE
LYMPHS ABS: 2.9 10*3/uL (ref 0.7–4.0)
Lymphocytes Relative: 54 % — ABNORMAL HIGH (ref 12–46)
MCH: 28.8 pg (ref 26.0–34.0)
MCHC: 33.2 g/dL (ref 30.0–36.0)
MCV: 86.7 fL (ref 78.0–100.0)
MONOS PCT: 6 % (ref 3–12)
MPV: 9.3 fL (ref 8.6–12.4)
Monocytes Absolute: 0.3 10*3/uL (ref 0.1–1.0)
Neutro Abs: 2.1 10*3/uL (ref 1.7–7.7)
Neutrophils Relative %: 38 % — ABNORMAL LOW (ref 43–77)
Platelets: 251 10*3/uL (ref 150–400)
RBC: 3.92 MIL/uL (ref 3.87–5.11)
RDW: 13.5 % (ref 11.5–15.5)
Rh Type: POSITIVE
Rubella: 4.25 Index — ABNORMAL HIGH (ref <0.90)
WBC: 5.4 10*3/uL (ref 4.0–10.5)

## 2014-11-17 LAB — VARICELLA ZOSTER ANTIBODY, IGG: Varicella IgG: 1063 Index — ABNORMAL HIGH (ref <135.00)

## 2014-11-17 LAB — PROLACTIN: PROLACTIN: 25.6 ng/mL

## 2014-11-17 LAB — HCG, QUANTITATIVE, PREGNANCY: hCG, Beta Chain, Quant, S: 352.2 m[IU]/mL

## 2014-11-17 LAB — VITAMIN D 25 HYDROXY (VIT D DEFICIENCY, FRACTURES): Vit D, 25-Hydroxy: 19 ng/mL — ABNORMAL LOW (ref 30–100)

## 2014-11-17 LAB — HIV ANTIBODY (ROUTINE TESTING W REFLEX): HIV: NONREACTIVE

## 2014-11-17 NOTE — Progress Notes (Signed)
Patient ID: Shelley Hardy, female   DOB: June 04, 1976, 39 y.o.   MRN: [id number]    Subjective:     Shelley Hardy is a 39 y.o. female here for a routine exam.  Current complaints:  Desires to discuss recent pregnancy with positive pregnancy test in office today.  Is stable on current antidepressants and hx of pituitary tumor.  Patient referred to MFM for consult/management recommendations on medications.  Has hx of two SABs, concerned about viability of current pregnancy.  English is her second language.    Personal health questionnaire:  Is patient Ashkenazi Jewish, have a family history of breast and/or ovarian cancer: no Is there a family history of uterine cancer diagnosed at age < 70, gastrointestinal cancer, urinary tract cancer, family member who is a Field seismologist syndrome-associated carrier: no Is the patient overweight and hypertensive, family history of diabetes, personal history of gestational diabetes, preeclampsia or PCOS: yes Is patient over 21, have PCOS,  family history of premature CHD under age 69, diabetes, smoke, have hypertension or peripheral artery disease:  yes At any time, has a partner hit, kicked or otherwise hurt or frightened you?: no Over the past 2 weeks, have you felt down, depressed or hopeless?: no Over the past 2 weeks, have you felt little interest or pleasure in doing things?:no   Gynecologic History Patient's last menstrual period was 10/02/2014. Contraception: none, currently pregnant  Obstetric History OB History  Gravida Para Term Preterm AB SAB TAB Ectopic Multiple Living  5 3 3  0 2 2 0 0 0 3    # Outcome Date GA Lbr Len/2nd Weight Sex Delivery Anes PTL Lv  5 Term 03/07/12 [redacted]w[redacted]d 24:09 / 00:10 3.912 kg (8 lb 10 oz) M Vag-Spont None  Y     Comments: WNL   4 SAB 05/28/11             Comments: System Generated. Please review and update pregnancy details.  3 SAB 11/10/10 [redacted]w[redacted]d         2 Term 03/30/09 [redacted]w[redacted]d  3.204 kg (7 lb 1 oz) F Vag-Spont None  Y   1 Term 10/24/06 [redacted]w[redacted]d  3.43 kg (7 lb 9 oz) F Vag-Spont EPI  Y      Past Medical History  Diagnosis Date  . Pituitary tumor   . AMA (advanced maternal age) multigravida 34+   . Depression     post partum    Past Surgical History  Procedure Laterality Date  . None       Current outpatient prescriptions:  .  cabergoline (DOSTINEX) 0.5 MG tablet, TAKE 1 TABLET BY MOUTH TWO TIMES A WEEK, Disp: 10 tablet, Rfl: 3 .  ibuprofen (ADVIL,MOTRIN) 600 MG tablet, Take 1 tablet (600 mg total) by mouth every 8 (eight) hours as needed., Disp: 30 tablet, Rfl: 1 .  nortriptyline (PAMELOR) 10 MG capsule, Take 30 mg by mouth at bedtime., Disp: , Rfl:  .  Prenatal Vit-Fe Fumarate-FA (PREPLUS) 27-1 MG TABS, TAKE 1 TABLET BY MOUTH EVERY DAY, Disp: 30 tablet, Rfl: 0 .  cyclobenzaprine (FLEXERIL) 10 MG tablet, Take 1 tablet (10 mg total) by mouth 2 (two) times daily as needed for muscle spasms. (Patient not taking: Reported on 11/16/2014), Disp: 20 tablet, Rfl: 0 .  HYDROcodone-acetaminophen (NORCO/VICODIN) 5-325 MG per tablet, Take 1-2 tablets by mouth every 4 (four) hours as needed. (Patient not taking: Reported on 11/16/2014), Disp: 10 tablet, Rfl: 0 .  Norethindrone Acetate-Ethinyl Estrad-FE (LOESTRIN 24 FE) 1-20 MG-MCG(24) tablet,  Take 1 tablet by mouth daily. (Patient not taking: Reported on 11/16/2014), Disp: 1 Package, Rfl: 11 .  Prenatal Vit-DSS-Fe Fum-FA (SE-NATAL 19) 29-1 MG TABS, Take 1 tablet by mouth daily. (Patient not taking: Reported on 11/16/2014), Disp: 30 tablet, Rfl: 11 .  traMADol (ULTRAM) 50 MG tablet, Take 1 tablet (50 mg total) by mouth every 6 (six) hours as needed. (Patient not taking: Reported on 11/16/2014), Disp: 15 tablet, Rfl: 0 No Known Allergies  History  Substance Use Topics  . Smoking status: Never Smoker   . Smokeless tobacco: Never Used  . Alcohol Use: No    Family History  Problem Relation Age of Onset  . Anemia Cousin     patient's first cousin once removed; parents of cousin  are first cousins to each other and to the patient  . Diabetes Maternal Grandmother   . Thyroid disease Maternal Aunt       Review of Systems  Constitutional: negative for fatigue and weight loss Respiratory: negative for cough and wheezing Cardiovascular: negative for chest pain, fatigue and palpitations Gastrointestinal: negative for abdominal pain and change in bowel habits Musculoskeletal:negative for myalgias Neurological: negative for gait problems and tremors Behavioral/Psych: negative for abusive relationship, depression Endocrine: negative for temperature intolerance   Genitourinary:negative for genital lesions, hot flashes, sexual problems and vaginal discharge. + abnormal menstrual periods  Integument/breast: negative for breast lump, breast tenderness, and skin lesion(s).  + nipple discharge d/t pituitary tumor    Objective:       BP 113/71 mmHg  Pulse 89  Wt 77.474 kg (170 lb 12.8 oz)  LMP 10/02/2014 General:   alert  Skin:   no rash or abnormalities  Lungs:   clear to auscultation bilaterally  Heart:   regular rate and rhythm, S1, S2 normal, no murmur, click, rub or gallop  Breasts:   normal without suspicious masses, skin or nipple changes or axillary nodes  Abdomen:  normal findings: no organomegaly, soft, non-tender and no hernia  Pelvis:  External genitalia: normal general appearance Urinary system: urethral meatus normal and bladder without fullness, nontender Vaginal: normal without tenderness, induration or masses Cervix: normal appearance, closed Adnexa: normal bimanual exam Uterus: anteverted and non-tender, normal size   Lab Review Urine pregnancy test Labs reviewed yes Radiologic studies reviewed no  50% of 30 min visit spent on counseling and coordination of care.   Assessment:   Hx of Pituitary tumor Current pregnancy about 6 wks by LMP   Plan:    Follow up in: 4 weeks. With NOB appointment, MFM referral and new OB labs drawn today.     No orders of the defined types were placed in this encounter.   Orders Placed This Encounter  Procedures  . Culture, OB Urine  . Obstetric panel  . HIV antibody  . Hemoglobinopathy evaluation  . Varicella zoster antibody, IgG  . Vit D  25 hydroxy (rtn osteoporosis monitoring)  . TSH  . Prolactin  . hCG, quantitative, pregnancy  . AMB referral to maternal fetal medicine    Referral Priority:  Routine    Referral Type:  Consultation    Referral Reason:  Specialty Services Required    Number of Visits Requested:  1  . POCT urine pregnancy

## 2014-11-18 ENCOUNTER — Other Ambulatory Visit: Payer: Self-pay | Admitting: Certified Nurse Midwife

## 2014-11-18 DIAGNOSIS — O0940 Supervision of pregnancy with grand multiparity, unspecified trimester: Secondary | ICD-10-CM

## 2014-11-18 LAB — HEMOGLOBINOPATHY EVALUATION
HEMOGLOBIN OTHER: 0 %
HGB S QUANTITAION: 0 %
Hgb A2 Quant: 2.8 % (ref 2.2–3.2)
Hgb A: 97.2 % (ref 96.8–97.8)
Hgb F Quant: 0 % (ref 0.0–2.0)

## 2014-11-18 LAB — CULTURE, OB URINE

## 2014-11-22 ENCOUNTER — Other Ambulatory Visit: Payer: Medicaid Other

## 2014-11-22 DIAGNOSIS — Z3490 Encounter for supervision of normal pregnancy, unspecified, unspecified trimester: Secondary | ICD-10-CM

## 2014-11-22 DIAGNOSIS — O0940 Supervision of pregnancy with grand multiparity, unspecified trimester: Secondary | ICD-10-CM

## 2014-11-23 LAB — HCG, QUANTITATIVE, PREGNANCY

## 2014-11-25 ENCOUNTER — Other Ambulatory Visit: Payer: Self-pay | Admitting: Certified Nurse Midwife

## 2014-11-25 DIAGNOSIS — O039 Complete or unspecified spontaneous abortion without complication: Secondary | ICD-10-CM

## 2014-11-26 ENCOUNTER — Telehealth: Payer: Self-pay

## 2014-11-26 NOTE — Telephone Encounter (Signed)
Patient notified to keep appointment for genetic counciling at MFM.

## 2014-11-26 NOTE — Telephone Encounter (Signed)
Called [facility] and let them know patient was no longer pregnant - she has appt in June with them for genetic counseling and consult - should we leave that? If we do ultrsound will need order put in here or for there

## 2014-12-09 ENCOUNTER — Other Ambulatory Visit: Payer: Medicaid Other

## 2014-12-14 ENCOUNTER — Telehealth: Payer: Self-pay | Admitting: Endocrinology

## 2014-12-14 ENCOUNTER — Ambulatory Visit: Payer: Medicaid Other | Admitting: Endocrinology

## 2014-12-14 ENCOUNTER — Other Ambulatory Visit: Payer: Self-pay | Admitting: *Deleted

## 2014-12-14 ENCOUNTER — Encounter: Payer: Medicaid Other | Admitting: Certified Nurse Midwife

## 2014-12-14 ENCOUNTER — Encounter: Payer: Self-pay | Admitting: *Deleted

## 2014-12-14 DIAGNOSIS — O09521 Supervision of elderly multigravida, first trimester: Secondary | ICD-10-CM

## 2014-12-14 MED ORDER — SE-NATAL 19 29-1 MG PO TABS: 1.0000 | ORAL_TABLET | Freq: Every day | ORAL | Status: DC

## 2014-12-14 NOTE — Telephone Encounter (Signed)
Letter mailed, this is the patients 3rd no show.

## 2014-12-14 NOTE — Telephone Encounter (Signed)
Patient no showed today's appt. Please advise on how to follow up. °A. No follow up necessary. °B. Follow up urgent. Contact patient immediately. °C. Follow up necessary. Contact patient and schedule visit in ___ days. °D. Follow up advised. Contact patient and schedule visit in ____weeks. ° °

## 2014-12-14 NOTE — Progress Notes (Signed)
Refill Prenatal Vitamin

## 2014-12-21 NOTE — Telephone Encounter (Signed)
LM for pt to call back to reschedule.

## 2014-12-28 ENCOUNTER — Ambulatory Visit (HOSPITAL_COMMUNITY): Payer: Medicaid Other

## 2014-12-28 ENCOUNTER — Ambulatory Visit (HOSPITAL_COMMUNITY): Payer: Medicaid Other | Attending: Obstetrics

## 2015-03-24 ENCOUNTER — Other Ambulatory Visit: Payer: Medicaid Other

## 2015-03-24 ENCOUNTER — Ambulatory Visit (INDEPENDENT_AMBULATORY_CARE_PROVIDER_SITE_OTHER): Payer: Medicaid Other | Admitting: Certified Nurse Midwife

## 2015-03-24 ENCOUNTER — Encounter: Payer: Self-pay | Admitting: Certified Nurse Midwife

## 2015-03-24 ENCOUNTER — Other Ambulatory Visit: Payer: Self-pay | Admitting: Certified Nurse Midwife

## 2015-03-24 VITALS — BP 111/67 | HR 82 | Temp 99.0°F | Ht 66.0 in | Wt 175.0 lb

## 2015-03-24 DIAGNOSIS — K640 First degree hemorrhoids: Secondary | ICD-10-CM

## 2015-03-24 DIAGNOSIS — Z87898 Personal history of other specified conditions: Secondary | ICD-10-CM

## 2015-03-24 DIAGNOSIS — Z01419 Encounter for gynecological examination (general) (routine) without abnormal findings: Secondary | ICD-10-CM | POA: Diagnosis not present

## 2015-03-24 DIAGNOSIS — Z23 Encounter for immunization: Secondary | ICD-10-CM

## 2015-03-24 DIAGNOSIS — N644 Mastodynia: Secondary | ICD-10-CM

## 2015-03-24 DIAGNOSIS — Z0000 Encounter for general adult medical examination without abnormal findings: Secondary | ICD-10-CM

## 2015-03-24 DIAGNOSIS — N6452 Nipple discharge: Secondary | ICD-10-CM

## 2015-03-24 DIAGNOSIS — N912 Amenorrhea, unspecified: Secondary | ICD-10-CM

## 2015-03-24 DIAGNOSIS — Z8759 Personal history of other complications of pregnancy, childbirth and the puerperium: Secondary | ICD-10-CM

## 2015-03-24 LAB — COMPREHENSIVE METABOLIC PANEL
ALBUMIN: 4.2 g/dL (ref 3.6–5.1)
ALK PHOS: 39 U/L (ref 33–115)
ALT: 14 U/L (ref 6–29)
AST: 14 U/L (ref 10–30)
BILIRUBIN TOTAL: 0.4 mg/dL (ref 0.2–1.2)
BUN: 13 mg/dL (ref 7–25)
CALCIUM: 9.5 mg/dL (ref 8.6–10.2)
CO2: 28 mmol/L (ref 20–31)
Chloride: 103 mmol/L (ref 98–110)
Creat: 0.47 mg/dL — ABNORMAL LOW (ref 0.50–1.10)
Glucose, Bld: 90 mg/dL (ref 65–99)
POTASSIUM: 4.2 mmol/L (ref 3.5–5.3)
Sodium: 140 mmol/L (ref 135–146)
Total Protein: 6.9 g/dL (ref 6.1–8.1)

## 2015-03-24 LAB — CBC WITH DIFFERENTIAL/PLATELET
Basophils Absolute: 0 10*3/uL (ref 0.0–0.1)
Basophils Relative: 0 % (ref 0–1)
EOS PCT: 2 % (ref 0–5)
Eosinophils Absolute: 0.1 10*3/uL (ref 0.0–0.7)
HEMATOCRIT: 35.7 % — AB (ref 36.0–46.0)
HEMOGLOBIN: 12 g/dL (ref 12.0–15.0)
LYMPHS ABS: 2.6 10*3/uL (ref 0.7–4.0)
LYMPHS PCT: 56 % — AB (ref 12–46)
MCH: 29.3 pg (ref 26.0–34.0)
MCHC: 33.6 g/dL (ref 30.0–36.0)
MCV: 87.3 fL (ref 78.0–100.0)
MPV: 9.2 fL (ref 8.6–12.4)
Monocytes Absolute: 0.4 10*3/uL (ref 0.1–1.0)
Monocytes Relative: 8 % (ref 3–12)
NEUTROS ABS: 1.6 10*3/uL — AB (ref 1.7–7.7)
Neutrophils Relative %: 34 % — ABNORMAL LOW (ref 43–77)
Platelets: 232 10*3/uL (ref 150–400)
RBC: 4.09 MIL/uL (ref 3.87–5.11)
RDW: 12.9 % (ref 11.5–15.5)
WBC: 4.7 10*3/uL (ref 4.0–10.5)

## 2015-03-24 LAB — TSH: TSH: 0.235 u[IU]/mL — ABNORMAL LOW (ref 0.350–4.500)

## 2015-03-24 LAB — TRIGLYCERIDES: Triglycerides: 267 mg/dL — ABNORMAL HIGH (ref <150)

## 2015-03-24 LAB — CHOLESTEROL, TOTAL: Cholesterol: 154 mg/dL (ref 125–200)

## 2015-03-24 LAB — HDL CHOLESTEROL: HDL: 44 mg/dL — ABNORMAL LOW (ref >=46)

## 2015-03-24 MED ORDER — DOCUSATE SODIUM 100 MG PO CAPS: 100.0000 mg | ORAL_CAPSULE | Freq: Two times a day (BID) | ORAL | Status: DC

## 2015-03-24 MED ORDER — HYDROCORTISONE ACETATE 25 MG RE SUPP: 25.0000 mg | Freq: Two times a day (BID) | RECTAL | Status: DC

## 2015-03-24 NOTE — Progress Notes (Addendum)
Patient ID: Shelley Hardy, female   DOB: 05-Apr-1976, 39 y.o.   MRN: [id number]    Subjective:        Shelley Hardy is a 39 y.o. female here for a routine exam.  Current complaints: amenorrhea since SAB.   Encouraged f/u with neurology with hx of pituitary tumor.  Patient did not go and see Maternal fetal medicine prior to SAB.  Desires full STD screening.    Personal health questionnaire:  Is patient Ashkenazi Jewish, have a family history of breast and/or ovarian cancer: no Is there a family history of uterine cancer diagnosed at age < 71, gastrointestinal cancer, urinary tract cancer, family member who is a Field seismologist syndrome-associated carrier: no Is the patient overweight and hypertensive, family history of diabetes, personal history of gestational diabetes, preeclampsia or PCOS: yes Is patient over 29, have PCOS,  family history of premature CHD under age 18, diabetes, smoke, have hypertension or peripheral artery disease:  yes At any time, has a partner hit, kicked or otherwise hurt or frightened you?: no Over the past 2 weeks, have you felt down, depressed or hopeless?: yes Over the past 2 weeks, have you felt little interest or pleasure in doing things?:no   Gynecologic History No LMP recorded. Contraception: none Last Pap: unknown. Results were: normal according to the patient Last mammogram: None.   Obstetric History OB History  Gravida Para Term Preterm AB SAB TAB Ectopic Multiple Living  5 3 3  0 2 2 0 0 0 3    # Outcome Date GA Lbr Len/2nd Weight Sex Delivery Anes PTL Lv  5 Term 03/07/12 [redacted]w[redacted]d 24:09 / 00:10 8 lb 10 oz (3.912 kg) M Vag-Spont None  Y     Comments: WNL   4 SAB 05/28/11             Comments: System Generated. Please review and update pregnancy details.  3 SAB 11/10/10 [redacted]w[redacted]d         2 Term 03/30/09 [redacted]w[redacted]d  7 lb 1 oz (3.204 kg) F Vag-Spont None  Y  1 Term 10/24/06 [redacted]w[redacted]d  7 lb 9 oz (3.43 kg) F Vag-Spont EPI  Y      Past Medical History  Diagnosis  Date  . Pituitary tumor   . AMA (advanced maternal age) multigravida 74+   . Depression     post partum    Past Surgical History  Procedure Laterality Date  . None       Current outpatient prescriptions:  .  ibuprofen (ADVIL,MOTRIN) 600 MG tablet, Take 1 tablet (600 mg total) by mouth every 8 (eight) hours as needed., Disp: 30 tablet, Rfl: 1 .  Prenatal Vit-Fe Fumarate-FA (PREPLUS) 27-1 MG TABS, TAKE 1 TABLET BY MOUTH EVERY DAY, Disp: 30 tablet, Rfl: 0 .  cabergoline (DOSTINEX) 0.5 MG tablet, TAKE 1 TABLET BY MOUTH TWO TIMES A WEEK (Patient not taking: Reported on 03/24/2015), Disp: 10 tablet, Rfl: 3 .  cyclobenzaprine (FLEXERIL) 10 MG tablet, Take 1 tablet (10 mg total) by mouth 2 (two) times daily as needed for muscle spasms. (Patient not taking: Reported on 11/16/2014), Disp: 20 tablet, Rfl: 0 .  HYDROcodone-acetaminophen (NORCO/VICODIN) 5-325 MG per tablet, Take 1-2 tablets by mouth every 4 (four) hours as needed. (Patient not taking: Reported on 11/16/2014), Disp: 10 tablet, Rfl: 0 .  Norethindrone Acetate-Ethinyl Estrad-FE (LOESTRIN 24 FE) 1-20 MG-MCG(24) tablet, Take 1 tablet by mouth daily. (Patient not taking: Reported on 11/16/2014), Disp: 1 Package, Rfl: 11 .  nortriptyline (  PAMELOR) 10 MG capsule, Take 30 mg by mouth at bedtime., Disp: , Rfl:  .  Prenatal Vit-DSS-Fe Fum-FA (SE-NATAL 19) 29-1 MG TABS, Take 1 tablet by mouth daily., Disp: 30 tablet, Rfl: 11 .  traMADol (ULTRAM) 50 MG tablet, Take 1 tablet (50 mg total) by mouth every 6 (six) hours as needed. (Patient not taking: Reported on 11/16/2014), Disp: 15 tablet, Rfl: 0 No Known Allergies  Social History  Substance Use Topics  . Smoking status: Never Smoker   . Smokeless tobacco: Never Used  . Alcohol Use: No    Family History  Problem Relation Age of Onset  . Anemia Cousin     patient's first cousin once removed; parents of cousin are first cousins to each other and to the patient  . Diabetes Maternal Grandmother   . Thyroid  disease Maternal Aunt       Review of Systems  Constitutional: negative for fatigue and weight loss Respiratory: negative for cough and wheezing Cardiovascular: negative for chest pain, fatigue and palpitations Gastrointestinal: negative for abdominal pain and change in bowel habits Musculoskeletal:negative for myalgias Neurological: negative for gait problems and tremors, + hx pituitary tumor Behavioral/Psych: negative for abusive relationship, depression Endocrine: negative for temperature intolerance   Genitourinary:negative for abnormal menstrual periods, genital lesions, hot flashes, sexual problems and vaginal discharge, + amenorrhea, + rectal bleeding/itching Integument/breast: negative for breast lump, breast tenderness, and skin lesion(s), + nipple discharge    Objective:       BP 111/67 mmHg  Pulse 82  Temp(Src) 99 F (37.2 C)  Ht 5\' 6"  (1.676 m)  Wt 175 lb (79.379 kg)  BMI 28.26 kg/m2 General:   alert  Skin:   no rash or abnormalities  Lungs:   clear to auscultation bilaterally  Heart:   regular rate and rhythm, S1, S2 normal, no murmur, click, rub or gallop  Breasts:   normal without suspicious masses, skin or nipple changes or axillary nodes  Abdomen:  normal findings: no organomegaly, soft, non-tender and no hernia  Pelvis:  External genitalia: normal general appearance Urinary system: urethral meatus normal and bladder without fullness, nontender Vaginal: normal without tenderness, induration or masses Cervix: normal appearance Adnexa: normal bimanual exam Uterus: anteverted and non-tender, enlarged size Rectum: small, erythremic hemorrhoids present   Lab Review Urine pregnancy test Labs reviewed yes Radiologic studies reviewed yes  50% of 30 min visit spent on counseling and coordination of care.   Assessment:    Healthy female exam.   Amenorrhea d/t pituitary tumor Recent SAB STD screening exam Hemorrhoids  Plan:    Education reviewed:  calcium supplements, depression evaluation, low fat, low cholesterol diet, safe sex/STD prevention, self breast exams, skin cancer screening and weight bearing exercise. Mammogram ordered. Follow up in: 1 year.  Patient to f/u with neurology and pituitary tumor  No orders of the defined types were placed in this encounter.   Orders Placed This Encounter  Procedures  . SureSwab, Vaginosis/Vaginitis Plus  . MM DIGITAL SCREENING BILATERAL    Standing Status: Future     Number of Occurrences:      Standing Expiration Date: 05/23/2016    Order Specific Question:  Reason for Exam (SYMPTOM  OR DIAGNOSIS REQUIRED)    Answer:  annual exam, hx of pituitary tumor, has bilateral nipple discharge, left breast pain    Order Specific Question:  Is the patient pregnant?    Answer:  No    Order Specific Question:  Preferred imaging location?  Answer:  [facility]  . US Pelvis Complete    Standing Status: Future     Number of Occurrences:      Standing Expiration Date: 05/23/2016    Order Specific Question:  Reason for Exam (SYMPTOM  OR DIAGNOSIS REQUIRED)    Answer:  recent SAB, amenorrhea since, hx pituitary tumor    Order Specific Question:  Preferred imaging location?    Answer:  Internal  . US Transvaginal Non-OB    Standing Status: Future     Number of Occurrences:      Standing Expiration Date: 05/23/2016    Order Specific Question:  Reason for Exam (SYMPTOM  OR DIAGNOSIS REQUIRED)    Answer:  recent SAB, amenorrhea since, hx pituitary tumor    Order Specific Question:  Preferred imaging location?    Answer:  Internal  . HIV antibody (with reflex)  . Hepatitis B surface antigen  . RPR  . Hepatitis C antibody  . TSH  . Prolactin  . CBC with Differential/Platelet  . Comprehensive metabolic panel  . Cholesterol, total  . Triglycerides  . HDL cholesterol  . B-HCG Quant

## 2015-03-25 LAB — HEPATITIS B SURFACE ANTIGEN: Hepatitis B Surface Ag: NEGATIVE

## 2015-03-25 LAB — HEPATITIS C ANTIBODY: HCV AB: NEGATIVE

## 2015-03-25 LAB — HIV ANTIBODY (ROUTINE TESTING W REFLEX): HIV: NONREACTIVE

## 2015-03-25 LAB — HCG, QUANTITATIVE, PREGNANCY

## 2015-03-25 LAB — RPR

## 2015-03-25 LAB — PROLACTIN: Prolactin: 200.1 ng/mL

## 2015-03-26 ENCOUNTER — Other Ambulatory Visit: Payer: Self-pay | Admitting: Certified Nurse Midwife

## 2015-03-26 DIAGNOSIS — R7989 Other specified abnormal findings of blood chemistry: Secondary | ICD-10-CM

## 2015-03-26 LAB — PAP, TP IMAGING W/ HPV RNA, RFLX HPV TYPE 16,18/45: HPV mRNA, High Risk: NOT DETECTED

## 2015-03-29 ENCOUNTER — Other Ambulatory Visit: Payer: Self-pay | Admitting: Certified Nurse Midwife

## 2015-03-29 DIAGNOSIS — B373 Candidiasis of vulva and vagina: Secondary | ICD-10-CM

## 2015-03-29 DIAGNOSIS — B9689 Other specified bacterial agents as the cause of diseases classified elsewhere: Secondary | ICD-10-CM

## 2015-03-29 DIAGNOSIS — B3731 Acute candidiasis of vulva and vagina: Secondary | ICD-10-CM

## 2015-03-29 DIAGNOSIS — N760 Acute vaginitis: Principal | ICD-10-CM

## 2015-03-29 LAB — SURESWAB, VAGINOSIS/VAGINITIS PLUS
Atopobium vaginae: DETECTED Log (cells/mL)
C. GLABRATA, DNA: DETECTED — AB
C. PARAPSILOSIS, DNA: NOT DETECTED
C. TROPICALIS, DNA: NOT DETECTED
C. albicans, DNA: NOT DETECTED
C. trachomatis RNA, TMA: NOT DETECTED
Gardnerella vaginalis: 7.1 Log (cells/mL)
LACTOBACILLUS SPECIES: NOT DETECTED Log (cells/mL)
MEGASPHAERA SPECIES: NOT DETECTED Log (cells/mL)
N. GONORRHOEAE RNA, TMA: NOT DETECTED
T. vaginalis RNA, QL TMA: NOT DETECTED

## 2015-03-29 MED ORDER — FLUCONAZOLE 100 MG PO TABS: 100.0000 mg | ORAL_TABLET | Freq: Once | ORAL | Status: DC

## 2015-03-29 MED ORDER — TERCONAZOLE 0.4 % VA CREA: 1.0000 | TOPICAL_CREAM | Freq: Every day | VAGINAL | Status: DC

## 2015-03-29 MED ORDER — METRONIDAZOLE 500 MG PO TABS
500.0000 mg | ORAL_TABLET | Freq: Two times a day (BID) | ORAL | Status: DC
Start: 2015-03-29 — End: 2015-11-15

## 2015-03-31 ENCOUNTER — Telehealth: Payer: Self-pay | Admitting: Endocrinology

## 2015-03-31 ENCOUNTER — Ambulatory Visit
Admission: RE | Admit: 2015-03-31 | Discharge: 2015-03-31 | Disposition: A | Discharge status: Home / Self Care | Payer: Medicaid Other | Source: Ambulatory Visit | Attending: Certified Nurse Midwife | Admitting: Certified Nurse Midwife

## 2015-03-31 ENCOUNTER — Other Ambulatory Visit: Payer: Self-pay | Admitting: Certified Nurse Midwife

## 2015-03-31 ENCOUNTER — Ambulatory Visit (INDEPENDENT_AMBULATORY_CARE_PROVIDER_SITE_OTHER): Payer: Medicaid Other

## 2015-03-31 DIAGNOSIS — Z87898 Personal history of other specified conditions: Secondary | ICD-10-CM

## 2015-03-31 DIAGNOSIS — Z8742 Personal history of other diseases of the female genital tract: Secondary | ICD-10-CM

## 2015-03-31 DIAGNOSIS — N644 Mastodynia: Secondary | ICD-10-CM

## 2015-03-31 DIAGNOSIS — N912 Amenorrhea, unspecified: Secondary | ICD-10-CM

## 2015-03-31 DIAGNOSIS — N6452 Nipple discharge: Secondary | ICD-10-CM

## 2015-03-31 DIAGNOSIS — Z8759 Personal history of other complications of pregnancy, childbirth and the puerperium: Secondary | ICD-10-CM

## 2015-03-31 DIAGNOSIS — Z8639 Personal history of other endocrine, nutritional and metabolic disease: Secondary | ICD-10-CM

## 2015-03-31 NOTE — Telephone Encounter (Signed)
Please call pt regarding medication and miscarriage [phone]

## 2015-03-31 NOTE — Telephone Encounter (Signed)
[email or web address]

## 2015-04-01 ENCOUNTER — Other Ambulatory Visit: Payer: Self-pay | Admitting: *Deleted

## 2015-04-01 MED ORDER — CABERGOLINE 0.5 MG PO TABS: ORAL_TABLET | ORAL | Status: DC

## 2015-04-04 ENCOUNTER — Other Ambulatory Visit: Payer: Self-pay | Admitting: *Deleted

## 2015-04-04 DIAGNOSIS — Z7689 Persons encountering health services in other specified circumstances: Secondary | ICD-10-CM

## 2015-04-04 NOTE — Progress Notes (Signed)
Referral of pt for PCP

## 2015-04-05 ENCOUNTER — Other Ambulatory Visit: Payer: Self-pay | Admitting: Certified Nurse Midwife

## 2015-04-05 ENCOUNTER — Telehealth: Payer: Self-pay

## 2015-04-05 NOTE — Telephone Encounter (Signed)
PATIENT HAS Shelley Canavan, MD  ON HER MEDICAID CARD - CALLED SEVERAL PLACES, AND WON'T SEE HER AS SHE HAD [place] MEDICAID AND THEY ARE NOT ON HER CARD - CALLED PATIENT TO CALL ME, TO SEE WHAT WE CAN DO FOR HER - MAY HAVE TO CHANGE HER CARD TO ANOTHER PCP

## 2015-04-07 ENCOUNTER — Other Ambulatory Visit: Payer: Medicaid Other

## 2015-04-07 DIAGNOSIS — R7989 Other specified abnormal findings of blood chemistry: Secondary | ICD-10-CM

## 2015-04-07 LAB — THYROID PANEL WITH TSH
Free Thyroxine Index: 2 (ref 1.4–3.8)
T3 Uptake: 30 % (ref 22–35)
T4 TOTAL: 6.5 ug/dL (ref 4.5–12.0)
TSH: 0.171 u[IU]/mL — ABNORMAL LOW (ref 0.350–4.500)

## 2015-04-18 ENCOUNTER — Other Ambulatory Visit (INDEPENDENT_AMBULATORY_CARE_PROVIDER_SITE_OTHER): Payer: Medicaid Other

## 2015-04-18 ENCOUNTER — Other Ambulatory Visit: Payer: Medicaid Other

## 2015-04-18 DIAGNOSIS — D352 Benign neoplasm of pituitary gland: Secondary | ICD-10-CM

## 2015-04-18 DIAGNOSIS — E042 Nontoxic multinodular goiter: Secondary | ICD-10-CM | POA: Diagnosis not present

## 2015-04-18 LAB — T4, FREE: Free T4: 0.69 ng/dL (ref 0.60–1.60)

## 2015-04-18 LAB — TSH: TSH: 0.36 u[IU]/mL (ref 0.35–4.50)

## 2015-04-19 LAB — PROLACTIN: Prolactin: 9.6 ng/mL

## 2015-04-21 ENCOUNTER — Ambulatory Visit: Payer: Medicaid Other | Admitting: Endocrinology

## 2015-04-25 ENCOUNTER — Encounter: Payer: Self-pay | Admitting: Endocrinology

## 2015-04-25 ENCOUNTER — Ambulatory Visit (INDEPENDENT_AMBULATORY_CARE_PROVIDER_SITE_OTHER): Payer: Medicaid Other | Admitting: Endocrinology

## 2015-04-25 VITALS — BP 116/78 | HR 78 | Temp 98.3°F | Resp 14 | Ht 66.0 in | Wt 177.0 lb

## 2015-04-25 DIAGNOSIS — E042 Nontoxic multinodular goiter: Secondary | ICD-10-CM | POA: Diagnosis not present

## 2015-04-25 DIAGNOSIS — D352 Benign neoplasm of pituitary gland: Secondary | ICD-10-CM | POA: Diagnosis not present

## 2015-04-25 NOTE — Patient Instructions (Signed)
Take 1 tab weekly of dostinex

## 2015-04-25 NOTE — Progress Notes (Signed)
Patient ID: Shelley Hardy, female   DOB: 1976/02/13, 39 y.o.   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 Details of this evaluation are not available Not clear when her first MRI scan was done, reports available only for the scans in 2011 in 2012 In 2012 MRI pituitary scan showed a 9 x 6 x 13 mm post contrast hyperintense mass anteriorly within the pituitary. This was not significantly changed in size with prior exam. She also had some milk discharge from her breasts.    She was treated with bromocriptine by her gynecologist about 3 years ago which he took this for only 4 months.  Her menstrual cycles were normal with this. She had a pregnancy in 2013 and subsequently had amenorrhea but was breast-feeding.  PROLACTIN level done in 07/2013 was 190 She was started on Dostinex on her initial consultation in 2/15, initially given half tablet twice a week and then 1 tablet twice a week  Recent history:  Prior to her last visit she was taking 1 tablet weekly on Dostinex in her prolactin level was upper normal at 25 She has had normal menstrual cycles on Dostinex until recently   She had a pregnancy in April which miscarried and she did not go back to her Dostinex or come back for follow-up.  Her gynecologist checked her prolactin level recently and it was 200     She has started taking Dostinex 1 tablet twice a week , has not had a menstrual cycles yet.  She has had a much lower prolactin level now, only 9.6.   Has no headaches   No visits with results within 1 Week(s) from this visit. Latest known visit with results is:  Appointment on 04/18/2015  Component Date Value Ref Range Status  . Prolactin 04/18/2015 9.6   Final   Comment:      Reference Ranges:                  Female:                       2.1 -  17.1 ng/ml                  Female:   Pregnant          9.7 - 208.5 ng/mL                            Non Pregnant      2.8 -  29.2 ng/mL                            Post Menopausal   1.8 -  20.3 ng/mL                      . TSH 04/18/2015 0.36  0.35 - 4.50 uIU/mL Final  . Free T4 04/18/2015 0.69  0.60 - 1.60 ng/dL Final     PROBLEM 2:  GOITER, multinodular  She was found to have a thyroid enlargement over 10 years ago. She has had periodic evaluations of  and thyroid functions have been  normal.  She feels that the goiter has been gradually increasing in size  She does feel some choking sensation during the night in certain positions and may have to cough.  She may also feel pressure in her neck at times.  No difficulty with swallowing or breathing. No local discomfort.  Thyroid scan report from 2004 was as follows: Low-normal thyroid uptake AT 12.8%. Impression: ABNORMAL THYROID SCAN, POSSIBLY DEMONSTRATING A HOT NODULE WITHIN THE RIGHT THYROID LOBE AND A COLD NODULE IN THE LEFT THYROID GLAND.  January 2015 ultrasound shows no distinct nodule but diffuse enlargement, left lobe larger measuring 9.2 x 6.7 x 5.1 cm.  Currently she does not feel like she wants to go have surgery  Lab Results  Component Value Date   TSH 0.36 04/18/2015   TSH 0.171* 04/07/2015   TSH 0.235* 03/24/2015   FREET4 0.69 04/18/2015   FREET4 0.97 08/07/2013   FREET4 0.99 08/13/2007     Past Medical History  Diagnosis Date  . Pituitary tumor ([place])   . AMA (advanced maternal age) multigravida 4+   . Depression     post partum    Past Surgical History  Procedure Laterality Date  . None      Family History  Problem Relation Age of Onset  . Anemia Cousin     patient's first cousin once removed; parents of cousin are first cousins to each other and to the patient  . Diabetes Maternal Grandmother   . Thyroid disease  Maternal Aunt     Social History:  reports that she has never smoked. She has never used smokeless tobacco. She reports that she does not drink alcohol or use illicit drugs.  Allergies: No Known Allergies    Medication List       This list is accurate as of: 04/25/15  9:08 AM.  Always use your most recent med list.               cabergoline 0.5 MG tablet  Commonly known as:  DOSTINEX  TAKE 1 TABLET BY MOUTH TWO TIMES A WEEK     docusate sodium 100 MG capsule  Commonly known as:  COLACE  Take 1 capsule (100 mg total) by mouth 2 (two) times daily.     fluconazole 100 MG tablet  Commonly known as:  DIFLUCAN  Take 1 tablet (100 mg total) by mouth once. Repeat dose in 48-72 hour.     HYDROcodone-acetaminophen 5-325 MG tablet  Commonly known as:  NORCO/VICODIN  Take 1-2 tablets by mouth every 4 (four) hours as needed.     hydrocortisone 25 MG suppository  Commonly known as:  ANUSOL-HC  Place 1 suppository (25 mg total) rectally 2 (two) times daily.     ibuprofen 600 MG tablet  Commonly known as:  ADVIL,MOTRIN  Take 1 tablet (600 mg total) by mouth every 8 (eight) hours as needed.     metroNIDAZOLE 500 MG tablet  Commonly known as:  FLAGYL  Take 1 tablet (500 mg total) by mouth 2 (two) times daily.     nortriptyline 10 MG capsule  Commonly known as:  PAMELOR  Take 30 mg by mouth at bedtime.     PREPLUS 27-1 MG Tabs  TAKE 1 TABLET BY MOUTH EVERY DAY     SE-NATAL 19 29-1 MG Tabs  Take 1 tablet by mouth daily.     terconazole 0.4 % vaginal cream  Commonly known as:  TERAZOL 7  Place 1 applicator vaginally at bedtime.  REVIEW OF SYSTEMS:        Neck circumference is 39.5 cm She has a large multinodular goiter bilaterally areas of firm nodules, right side is larger than left  PHYSICAL EXAM:  BP 116/78 mmHg  Pulse 78  Temp(Src) 98.3 F (36.8 C)  Resp 14  Ht 5\' 6"  (1.676 m)  Wt 177 lb (80.287 kg)  BMI 28.58 kg/m2  SpO2 99%   ASSESSMENT:     Prolactinoma, presenting as amenorrhea and galactorrhea and baseline prolactin of 190.  More recently she had not been taking her medication and with restarting Dostinex her prolactin level is back down to 9.0 Currently taking 0.5 mg twice a week  GOITER: She has a large multinodular goiter which may be slightly larger on exam today She does have a hot nodule and a cold nodule on scan and most recent thyroid levels are normal   PLAN:   She will take her Dostinex once a week as she does not like to break them in half She will need to have a prolactin done again in follow-up in 3 months  Observation only for goiter. She will have thyroid function tests also on the next visit Discussed that if she has continued pressure symptoms she may need to consider surgery especially since I-131 treatment may not work with her I-131 uptake being low   Haidar Muse 04/25/2015, 9:08 AM

## 2015-05-16 ENCOUNTER — Other Ambulatory Visit: Payer: Medicaid Other

## 2015-06-21 ENCOUNTER — Other Ambulatory Visit: Payer: Medicaid Other

## 2015-06-24 ENCOUNTER — Ambulatory Visit: Payer: Medicaid Other | Admitting: Endocrinology

## 2015-06-27 ENCOUNTER — Other Ambulatory Visit: Payer: Self-pay | Admitting: Ophthalmology

## 2015-06-27 DIAGNOSIS — G8929 Other chronic pain: Secondary | ICD-10-CM

## 2015-06-27 DIAGNOSIS — R51 Headache: Principal | ICD-10-CM

## 2015-06-29 ENCOUNTER — Other Ambulatory Visit (INDEPENDENT_AMBULATORY_CARE_PROVIDER_SITE_OTHER): Payer: Medicaid Other

## 2015-06-29 DIAGNOSIS — E042 Nontoxic multinodular goiter: Secondary | ICD-10-CM | POA: Diagnosis not present

## 2015-06-29 DIAGNOSIS — D352 Benign neoplasm of pituitary gland: Secondary | ICD-10-CM

## 2015-06-29 LAB — T4, FREE: Free T4: 0.73 ng/dL (ref 0.60–1.60)

## 2015-06-29 LAB — T3, FREE: T3, Free: 3.5 pg/mL (ref 2.3–4.2)

## 2015-06-29 LAB — TSH: TSH: 0.51 u[IU]/mL (ref 0.35–4.50)

## 2015-06-30 LAB — PROLACTIN: Prolactin: 21.8 ng/mL (ref 4.8–23.3)

## 2015-07-05 ENCOUNTER — Other Ambulatory Visit: Payer: Medicaid Other

## 2015-07-07 ENCOUNTER — Ambulatory Visit
Admission: RE | Admit: 2015-07-07 | Discharge: 2015-07-07 | Disposition: A | Discharge status: Home / Self Care | Payer: Medicaid Other | Source: Ambulatory Visit | Attending: Ophthalmology | Admitting: Ophthalmology

## 2015-07-07 ENCOUNTER — Ambulatory Visit (INDEPENDENT_AMBULATORY_CARE_PROVIDER_SITE_OTHER): Payer: Medicaid Other | Admitting: Endocrinology

## 2015-07-07 VITALS — BP 110/68 | HR 77 | Temp 98.4°F | Resp 14 | Ht 66.0 in | Wt 173.0 lb

## 2015-07-07 DIAGNOSIS — G8929 Other chronic pain: Secondary | ICD-10-CM

## 2015-07-07 DIAGNOSIS — D352 Benign neoplasm of pituitary gland: Secondary | ICD-10-CM | POA: Diagnosis not present

## 2015-07-07 DIAGNOSIS — R51 Headache: Principal | ICD-10-CM

## 2015-07-07 DIAGNOSIS — E042 Nontoxic multinodular goiter: Secondary | ICD-10-CM

## 2015-07-07 MED ORDER — GADOBENATE DIMEGLUMINE 529 MG/ML IV SOLN
15.0000 mL | Freq: Once | INTRAVENOUS | Status: AC | PRN
  Administered 2015-07-07: 15 mL via INTRAVENOUS

## 2015-07-07 NOTE — Progress Notes (Signed)
Patient ID: Shelley Hardy, female   DOB: May 25, 1976, 39 y.o.   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 Details of this evaluation are not available Not clear when her first MRI scan was done, reports available only for the scans in 2011 in 2012 In 2012 MRI pituitary scan showed a 9 x 6 x 13 mm post contrast hyperintense mass anteriorly within the pituitary. This was not significantly changed in size with prior exam. She also had some milk discharge from her breasts.    She was treated with bromocriptine by her gynecologist about 3 years ago which he took this for only 4 months.  Her menstrual cycles were normal with this. She had a pregnancy in 2013 and subsequently had amenorrhea but was breast-feeding.  PROLACTIN level done in 07/2013 was 190 She was started on Dostinex on her initial consultation in 2/15, initially given half tablet twice a week and then 1 tablet twice a week   She had a pregnancy in April 2016 which miscarried and she did not go back to her Dostinex or come back for follow-up.  Her gynecologist checked her prolactin level in 9/16 and it was 200    Recent history: She has had normal menstrual cycles on Dostinex  Since her prolactin level was only 9 her dose was reduced to once a week Her prolactin is back to normal consistently now    Has no headaches but her eye specialist was concerned about possibly a visual field defect and had an MRI done today   No visits with results within 1 Week(s) from this visit. Latest known visit with results is:  Lab on 06/29/2015  Component Date Value Ref Range Status  . Prolactin 06/29/2015 21.8  4.8 - 23.3 ng/mL Final  . TSH 06/29/2015 0.51  0.35 - 4.50 uIU/mL Final  . Free T4 06/29/2015 0.73  0.60 - 1.60  ng/dL Final  . T3, Free 06/29/2015 3.5  2.3 - 4.2 pg/mL Final     PROBLEM 2:  GOITER, multinodular  She was found to have a thyroid enlargement over 10 years ago. She has had periodic evaluations of  and thyroid functions have been  normal. She feels that the goiter has been gradually increasing in size  She does feel some choking sensation during the night in certain positions and may have to cough.  She may also feel pressure in her neck at times.  No difficulty with swallowing or breathing. No local discomfort. Her last exam showed a large multinodular goiter bilaterally areas of firm nodules, right side is larger than left with neck circumference 39.5 cm  Thyroid scan report from 2004 was as follows: Low-normal thyroid uptake AT 12.8%. Impression: ABNORMAL THYROID SCAN, POSSIBLY DEMONSTRATING A HOT NODULE WITHIN THE RIGHT THYROID LOBE AND A COLD NODULE IN THE LEFT THYROID GLAND.  January 2015 ultrasound shows no distinct nodule but diffuse enlargement, left lobe larger measuring 9.2 x 6.7 x 5.1 cm.    Lab Results  Component Value Date   TSH 0.51 06/29/2015   TSH 0.36 04/18/2015   TSH 0.171* 04/07/2015   FREET4 0.73 06/29/2015   FREET4 0.69 04/18/2015   FREET4 0.97 08/07/2013     Past Medical History  Diagnosis Date  . Pituitary tumor ([place])   .  AMA (advanced maternal age) multigravida 42+   . Depression     post partum    Past Surgical History  Procedure Laterality Date  . None      Family History  Problem Relation Age of Onset  . Anemia Cousin     patient's first cousin once removed; parents of cousin are first cousins to each other and to the patient  . Diabetes Maternal Grandmother   . Thyroid disease Maternal Aunt     Social History:  reports that she has never smoked. She has never used smokeless tobacco. She reports that she does not drink alcohol or use illicit drugs.  Allergies: No Known Allergies    Medication List       This list is accurate  as of: 07/07/15  9:22 AM.  Always use your most recent med list.               cabergoline 0.5 MG tablet  Commonly known as:  DOSTINEX  TAKE 1 TABLET BY MOUTH TWO TIMES A WEEK     docusate sodium 100 MG capsule  Commonly known as:  COLACE  Take 1 capsule (100 mg total) by mouth 2 (two) times daily.     fluconazole 100 MG tablet  Commonly known as:  DIFLUCAN  Take 1 tablet (100 mg total) by mouth once. Repeat dose in 48-72 hour.     HYDROcodone-acetaminophen 5-325 MG tablet  Commonly known as:  NORCO/VICODIN  Take 1-2 tablets by mouth every 4 (four) hours as needed.     hydrocortisone 25 MG suppository  Commonly known as:  ANUSOL-HC  Place 1 suppository (25 mg total) rectally 2 (two) times daily.     ibuprofen 600 MG tablet  Commonly known as:  ADVIL,MOTRIN  Take 1 tablet (600 mg total) by mouth every 8 (eight) hours as needed.     metroNIDAZOLE 500 MG tablet  Commonly known as:  FLAGYL  Take 1 tablet (500 mg total) by mouth 2 (two) times daily.     nortriptyline 10 MG capsule  Commonly known as:  PAMELOR  Take 30 mg by mouth at bedtime.     PREPLUS 27-1 MG Tabs  TAKE 1 TABLET BY MOUTH EVERY DAY     SE-NATAL 19 29-1 MG Tabs  Take 1 tablet by mouth daily.     terconazole 0.4 % vaginal cream  Commonly known as:  TERAZOL 7  Place 1 applicator vaginally at bedtime.          REVIEW OF SYSTEMS:         PHYSICAL EXAM:  BP 110/68 mmHg  Pulse 77  Temp(Src) 98.4 F (36.9 C) (Oral)  Resp 14  Ht 5\' 6"  (1.676 m)  Wt 173 lb (78.472 kg)  BMI 27.94 kg/m2  SpO2 98%   ASSESSMENT:    Prolactinoma, presenting as amenorrhea and galactorrhea and baseline prolactin of 190 several years ago.  She continues to require Dostinex consistently poor control of hyperprolactinemia and amenorrhea With taking 0.5 mg weekly her prolactin is upper normal at 21, was only 9.0 with taking it twice a week She is having normal menstrual cycles recently  GOITER: She has a large  multinodular goiter which may be slightly larger on exam today She does have a hot nodule and a cold nodule on scan and most recent thyroid levels are normal   PLAN:   She will take her Dostinex once a week as before and follow-up in 4 months  Observation only  for goiter unless she has significant compression symptoms.    Rosevelt Luu 07/07/2015, 9:22 AM

## 2015-10-31 ENCOUNTER — Other Ambulatory Visit: Payer: Medicaid Other

## 2015-11-03 ENCOUNTER — Ambulatory Visit: Payer: Medicaid Other | Admitting: Endocrinology

## 2015-11-03 DIAGNOSIS — Z0289 Encounter for other administrative examinations: Secondary | ICD-10-CM

## 2015-11-15 ENCOUNTER — Ambulatory Visit: Payer: Medicaid Other | Attending: Family Medicine | Admitting: Family Medicine

## 2015-11-15 ENCOUNTER — Encounter: Payer: Self-pay | Admitting: Family Medicine

## 2015-11-15 VITALS — BP 110/77 | HR 70 | Temp 98.7°F | Resp 16 | Ht 66.0 in | Wt 175.2 lb

## 2015-11-15 DIAGNOSIS — K640 First degree hemorrhoids: Secondary | ICD-10-CM

## 2015-11-15 DIAGNOSIS — R200 Anesthesia of skin: Secondary | ICD-10-CM | POA: Diagnosis not present

## 2015-11-15 DIAGNOSIS — E049 Nontoxic goiter, unspecified: Secondary | ICD-10-CM | POA: Diagnosis present

## 2015-11-15 DIAGNOSIS — D352 Benign neoplasm of pituitary gland: Secondary | ICD-10-CM | POA: Insufficient documentation

## 2015-11-15 DIAGNOSIS — Z79899 Other long term (current) drug therapy: Secondary | ICD-10-CM | POA: Insufficient documentation

## 2015-11-15 DIAGNOSIS — G5602 Carpal tunnel syndrome, left upper limb: Secondary | ICD-10-CM

## 2015-11-15 DIAGNOSIS — M7989 Other specified soft tissue disorders: Secondary | ICD-10-CM | POA: Diagnosis not present

## 2015-11-15 DIAGNOSIS — E781 Pure hyperglyceridemia: Secondary | ICD-10-CM | POA: Diagnosis not present

## 2015-11-15 DIAGNOSIS — K603 Anal fistula: Secondary | ICD-10-CM | POA: Diagnosis not present

## 2015-11-15 MED ORDER — HYDROCORTISONE ACETATE 25 MG RE SUPP: 25.0000 mg | Freq: Two times a day (BID) | RECTAL | Status: DC

## 2015-11-15 NOTE — Progress Notes (Signed)
Subjective:  Patient ID: Shelley Hardy, female    DOB: [birth year]  Age: 40 y.o. MRN: [id number]  CC: Numbness; Hand Pain; Foot Swelling; and Rectal Problems   HPI Shelley Hardy is a 40 year old female with a history of prolactinoma, goiter (last thyroid panel was normal) followed by [facility]inology who comes into the clinic today for regular office visit She was previously followed by Dr. Annitta Needs and is here to establish care with me.  She complains of bright red blood par rectum which occurs after she moves her bowels and when she wipes and has also noticed a bump above the anus for the last 1 year with intermittent purulent discharge but denies pain at this time. She denies constipation and has to use OTC suppositories with no much improvement in symptoms.  Complains of left wrist numbness which occurs more when she is asleep but is limited to the medial 3 fingers, no history of trauma to the wrist and she denies pain in the wrist; she is right-handed.  Was previously told she had elevated triglycerides like to have a repeat lipid panel    Outpatient Prescriptions Prior to Visit  Medication Sig Dispense Refill  . docusate sodium (COLACE) 100 MG capsule Take 1 capsule (100 mg total) by mouth 2 (two) times daily. 60 capsule 12  . ibuprofen (ADVIL,MOTRIN) 600 MG tablet Take 1 tablet (600 mg total) by mouth every 8 (eight) hours as needed. 30 tablet 1  . Prenatal Vit-DSS-Fe Fum-FA (SE-NATAL 19) 29-1 MG TABS Take 1 tablet by mouth daily. 30 tablet 11  . Prenatal Vit-Fe Fumarate-FA (PREPLUS) 27-1 MG TABS TAKE 1 TABLET BY MOUTH EVERY DAY 30 tablet 0  . cabergoline (DOSTINEX) 0.5 MG tablet TAKE 1 TABLET BY MOUTH TWO TIMES A WEEK (Patient not taking: Reported on 11/15/2015) 10 tablet 0  . HYDROcodone-acetaminophen (NORCO/VICODIN) 5-325 MG per tablet Take 1-2 tablets by mouth every 4 (four) hours as needed. (Patient not taking: Reported on 11/15/2015) 10 tablet 0  . nortriptyline  (PAMELOR) 10 MG capsule Take 30 mg by mouth at bedtime. Reported on 11/15/2015    . terconazole (TERAZOL 7) 0.4 % vaginal cream Place 1 applicator vaginally at bedtime. (Patient not taking: Reported on 11/15/2015) 45 g 0  . fluconazole (DIFLUCAN) 100 MG tablet Take 1 tablet (100 mg total) by mouth once. Repeat dose in 48-72 hour. (Patient not taking: Reported on 11/15/2015) 3 tablet 0  . hydrocortisone (ANUSOL-HC) 25 MG suppository Place 1 suppository (25 mg total) rectally 2 (two) times daily. (Patient not taking: Reported on 11/15/2015) 12 suppository 0  . metroNIDAZOLE (FLAGYL) 500 MG tablet Take 1 tablet (500 mg total) by mouth 2 (two) times daily. (Patient not taking: Reported on 11/15/2015) 14 tablet 0   No facility-administered medications prior to visit.    ROS Review of Systems  Constitutional: Negative for activity change, appetite change and fatigue.  HENT: Negative for congestion, sinus pressure and sore throat.   Eyes: Negative for visual disturbance.  Respiratory: Negative for cough, chest tightness, shortness of breath and wheezing.   Cardiovascular: Negative for chest pain and palpitations.  Gastrointestinal: Positive for blood in stool. Negative for abdominal pain, constipation, abdominal distention and rectal pain.  Endocrine: Negative for polydipsia.  Genitourinary: Negative for dysuria and frequency.  Musculoskeletal: Negative for back pain and arthralgias.  Skin: Negative for rash.  Neurological: Positive for numbness. Negative for tremors and light-headedness.  Hematological: Does not bruise/bleed easily.  Psychiatric/Behavioral: Negative for behavioral problems  and agitation.    Objective:  BP 110/77 mmHg  Pulse 70  Temp(Src) 98.7 F (37.1 C) (Oral)  Resp 16  Ht 5\' 6"  (1.676 m)  Wt 175 lb 3.2 oz (79.47 kg)  BMI 28.29 kg/m2  SpO2 99%  BP/Weight 11/15/2015 07/07/2015 [id number]  Systolic BP [id number]  Diastolic BP 77 68 78  Wt. (Lbs) 175.2 173 177  BMI 28.29 27.94  28.58      Physical Exam  Constitutional: She is oriented to person, place, and time. She appears well-developed and well-nourished.  Neck: Thyromegaly present.  Cardiovascular: Normal rate, normal heart sounds and intact distal pulses.   No murmur heard. Pulmonary/Chest: Effort normal and breath sounds normal. She has no wheezes. She has no rales. She exhibits no tenderness.  Abdominal: Soft. Bowel sounds are normal. She exhibits no distension and no mass. There is no tenderness.  Genitourinary:  Rectum with hemorrhoids that 6 o'clock and nodule about 12 o'clock which is hypopigmented with a punctum, no discharge noted  Musculoskeletal: Normal range of motion.  Neurological: She is alert and oriented to person, place, and time.     Assessment & Plan:   1. Goiter Normal thyroid panel  2. Prolactinoma [facility]) Previously on Dostinex which she has stopped Advised to keep appointment with endocrine  3. First degree hemorrhoids Advised on sitz bath We'll see back at next office visit to monitor symptoms and coordinate care - hydrocortisone (ANUSOL-HC) 25 MG suppository; Place 1 suppository (25 mg total) rectally 2 (two) times daily.  Dispense: 12 suppository; Refill: 0  4. [place] - Ambulatory referral to General Surgery  5. Hypertriglyceridemia - COMPLETE METABOLIC PANEL WITH GFR; Future - Lipid panel; Future  6. Carpal tunnel syndrome of left wrist Advised to use left wrist brace   Meds ordered this encounter  Medications  . hydrocortisone (ANUSOL-HC) 25 MG suppository    Sig: Place 1 suppository (25 mg total) rectally 2 (two) times daily.    Dispense:  12 suppository    Refill:  0    Follow-up: Return in about 1 month (around 12/16/2015) for Follow-up from hemorrhoids.   Arnoldo Morale MD

## 2015-11-15 NOTE — Progress Notes (Signed)
Patient's here for numbness in fingers and L foot that's off and on, rated at 5/10.  Patient concern of rectal bleeding with a yellow d/c that has present for more than a year. OTC cream doesn't help.  Patient requesting med refill.

## 2015-11-17 ENCOUNTER — Ambulatory Visit: Payer: Medicaid Other | Attending: Family Medicine

## 2015-11-17 DIAGNOSIS — E781 Pure hyperglyceridemia: Secondary | ICD-10-CM

## 2015-11-17 LAB — COMPLETE METABOLIC PANEL WITH GFR
ALBUMIN: 4 g/dL (ref 3.6–5.1)
ALK PHOS: 36 U/L (ref 33–115)
ALT: 12 U/L (ref 6–29)
AST: 13 U/L (ref 10–30)
BILIRUBIN TOTAL: 0.5 mg/dL (ref 0.2–1.2)
BUN: 8 mg/dL (ref 7–25)
CO2: 25 mmol/L (ref 20–31)
CREATININE: 0.5 mg/dL (ref 0.50–1.10)
Calcium: 9 mg/dL (ref 8.6–10.2)
Chloride: 103 mmol/L (ref 98–110)
GFR, Est African American: >89 mL/min (ref >=60)
GLUCOSE: 90 mg/dL (ref 65–99)
Potassium: 4.6 mmol/L (ref 3.5–5.3)
SODIUM: 136 mmol/L (ref 135–146)
TOTAL PROTEIN: 6.4 g/dL (ref 6.1–8.1)

## 2015-11-17 LAB — LIPID PANEL
Cholesterol: 159 mg/dL (ref 125–200)
HDL: 48 mg/dL (ref >=46)
LDL CALC: 82 mg/dL (ref <130)
Total CHOL/HDL Ratio: 3.3 Ratio (ref <=5.0)
Triglycerides: 144 mg/dL (ref <150)
VLDL: 29 mg/dL (ref <30)

## 2015-12-13 ENCOUNTER — Other Ambulatory Visit (INDEPENDENT_AMBULATORY_CARE_PROVIDER_SITE_OTHER): Payer: Medicaid Other

## 2015-12-13 ENCOUNTER — Encounter: Payer: Self-pay | Admitting: *Deleted

## 2015-12-13 VITALS — BP 103/71 | HR 78 | Wt 174.0 lb

## 2015-12-13 DIAGNOSIS — Z3201 Encounter for pregnancy test, result positive: Secondary | ICD-10-CM | POA: Diagnosis not present

## 2015-12-13 DIAGNOSIS — Z3490 Encounter for supervision of normal pregnancy, unspecified, unspecified trimester: Secondary | ICD-10-CM

## 2015-12-13 DIAGNOSIS — Z3200 Encounter for pregnancy test, result unknown: Secondary | ICD-10-CM

## 2015-12-13 LAB — POCT URINE PREGNANCY: Preg Test, Ur: POSITIVE — AB

## 2015-12-13 NOTE — Progress Notes (Unsigned)
Pt is in office for UPT/confirmation.  Pt has had positive at home test.  UPT in office today if positive.  Pt states her LMP was 11-03-15, dating her at 5.[redacted] weeks gestation. EDD of 08-09-16 was given.  Pt states she would like to have lab work due to history of SAB. Pt feels concerned this may happen again. Pt also states that she has previously has increase in prolactin levels. Reviewed with RDenney, prenatal labs, HCG-quant, and prolactin levels were ordered along with MFM consult for AMA, hx of SAB. Pt was made aware to return to office for repeat quant level.  Pt states that she can come in on Thursday and has been scheduled.  Pt has been made aware to schedule NOB appt.  Pt advised to call office with any problems given her history.  Pt was also given letter of confirmation for insurance.  Pt was given samples of PreNate Chewable as she request gelatin free vitamin.

## 2015-12-14 ENCOUNTER — Other Ambulatory Visit: Payer: Self-pay | Admitting: Certified Nurse Midwife

## 2015-12-14 DIAGNOSIS — R7989 Other specified abnormal findings of blood chemistry: Secondary | ICD-10-CM

## 2015-12-14 DIAGNOSIS — E229 Hyperfunction of pituitary gland, unspecified: Principal | ICD-10-CM

## 2015-12-14 LAB — PRENATAL PROFILE I(LABCORP)
ANTIBODY SCREEN: NEGATIVE
BASOS ABS: 0 10*3/uL (ref 0.0–0.2)
BASOS: 1 %
EOS (ABSOLUTE): 0.1 10*3/uL (ref 0.0–0.4)
Eos: 2 %
HEMATOCRIT: 35.6 % (ref 34.0–46.6)
Hemoglobin: 12 g/dL (ref 11.1–15.9)
Hepatitis B Surface Ag: NEGATIVE
Immature Grans (Abs): 0 10*3/uL (ref 0.0–0.1)
Immature Granulocytes: 0 %
LYMPHS ABS: 2.3 10*3/uL (ref 0.7–3.1)
Lymphs: 51 %
MCH: 29.1 pg (ref 26.6–33.0)
MCHC: 33.7 g/dL (ref 31.5–35.7)
MCV: 86 fL (ref 79–97)
MONOS ABS: 0.3 10*3/uL (ref 0.1–0.9)
Monocytes: 8 %
NEUTROS ABS: 1.7 10*3/uL (ref 1.4–7.0)
Neutrophils: 38 %
PLATELETS: 245 10*3/uL (ref 150–379)
RBC: 4.13 x10E6/uL (ref 3.77–5.28)
RDW: 13.4 % (ref 12.3–15.4)
RH TYPE: POSITIVE
RPR Ser Ql: NONREACTIVE
Rubella Antibodies, IGG: 5.36 index (ref >0.99)
WBC: 4.4 10*3/uL (ref 3.4–10.8)

## 2015-12-14 LAB — HEMOGLOBINOPATHY EVALUATION
HEMOGLOBIN F QUANTITATION: 0 % (ref 0.0–2.0)
HGB A: 97.2 % (ref 94.0–98.0)
HGB C: 0 %
HGB S: 0 %
Hemoglobin A2 Quantitation: 2.8 % (ref 0.7–3.1)

## 2015-12-14 LAB — PROLACTIN: PROLACTIN: 68.1 ng/mL — AB (ref 4.8–23.3)

## 2015-12-14 LAB — VITAMIN D 25 HYDROXY (VIT D DEFICIENCY, FRACTURES): Vit D, 25-Hydroxy: 17.6 ng/mL — ABNORMAL LOW (ref 30.0–100.0)

## 2015-12-14 LAB — BETA HCG QUANT (REF LAB): hCG Quant: 1926 m[IU]/mL

## 2015-12-14 LAB — HIV ANTIBODY (ROUTINE TESTING W REFLEX): HIV SCREEN 4TH GENERATION: NONREACTIVE

## 2015-12-14 LAB — VARICELLA ZOSTER ANTIBODY, IGG: VARICELLA: 1202 {index} (ref >165)

## 2015-12-15 ENCOUNTER — Encounter: Payer: Self-pay | Admitting: *Deleted

## 2015-12-15 ENCOUNTER — Other Ambulatory Visit: Payer: Self-pay | Admitting: Certified Nurse Midwife

## 2015-12-15 ENCOUNTER — Ambulatory Visit (HOSPITAL_COMMUNITY)
Admission: RE | Admit: 2015-12-15 | Discharge: 2015-12-15 | Disposition: A | Discharge status: Home / Self Care | Payer: Medicaid Other | Source: Ambulatory Visit | Attending: Certified Nurse Midwife | Admitting: Certified Nurse Midwife

## 2015-12-15 ENCOUNTER — Encounter (HOSPITAL_COMMUNITY): Payer: Self-pay

## 2015-12-15 ENCOUNTER — Other Ambulatory Visit: Payer: Medicaid Other

## 2015-12-15 DIAGNOSIS — O09529 Supervision of elderly multigravida, unspecified trimester: Secondary | ICD-10-CM | POA: Insufficient documentation

## 2015-12-15 DIAGNOSIS — O2620 Pregnancy care for patient with recurrent pregnancy loss, unspecified trimester: Secondary | ICD-10-CM

## 2015-12-15 DIAGNOSIS — O2341 Unspecified infection of urinary tract in pregnancy, first trimester: Secondary | ICD-10-CM

## 2015-12-15 DIAGNOSIS — O3680X0 Pregnancy with inconclusive fetal viability, not applicable or unspecified: Secondary | ICD-10-CM

## 2015-12-15 DIAGNOSIS — Z3A01 Less than 8 weeks gestation of pregnancy: Secondary | ICD-10-CM | POA: Insufficient documentation

## 2015-12-15 LAB — URINE CULTURE, OB REFLEX

## 2015-12-15 LAB — CULTURE, OB URINE

## 2015-12-15 MED ORDER — NITROFURANTOIN MONOHYD MACRO 100 MG PO CAPS: 100.0000 mg | ORAL_CAPSULE | Freq: Two times a day (BID) | ORAL | Status: AC

## 2015-12-15 NOTE — Progress Notes (Signed)
Genetic Counseling  High-Risk Gestation Note  Appointment Date:  12/15/2015 Referred By: Morene Crocker, CNM Date of Birth:  Sep 17, 1975 Partner: Shelley Hardy   Pregnancy [id number] Estimated Date of Delivery: 08/09/16  Estimated Gestational Age: [redacted]w[redacted]d  Attending: Renella Cunas, MD   [name] was seen for genetic counseling because of a maternal age of 40 y.o..     In summary:  New OB visit not yet performed; scheduled for 01/10/16  Discussed AMA and associated risk for fetal aneuploidy  Discussed options for screening  First screen-declined  Quad screen-declined  NIPS-patient would like to pursue at appropriate gestational age  Shelley Hardy would like NT ultrasound  Discussed diagnostic testing options  [facility]-patient declined  Amniocentesis-patient declined  History of recurrent miscarriage for patient  Couple has history of 5 first trimester spontaneous abortions, in addition to their three children  No workup performed to patient's knowledge  Reviewed option of peripheral blood chromosome analysis for patient and partner to assess for underlying chromosome rearrangement-not performed today  MFM consultation can be scheduled in the future to discuss and address additional possible workup regarding this history  Viability scan scheduled for 12/22/15  Reviewed family history concerns  Patient's son has Glucose-6-Phosphate Dehydrogenase (G6PD) deficiency  Reviewed X-linked inheritance; Recurrence risk is 1 in 4 (25%)  If pregnancy is known female pregnancy, recurrence risk adjusted to 1 in 2 (50%)  Patient not interested in prenatal diagnosis, would pursue postnatal evaluation  Discussed carrier screening options- patient previously had normal hemoglobin electrophoresis  Advanced paternal age: Father of the pregnancy is 50 years old.   She was counseled regarding maternal age and the association with risk for chromosome conditions due  to nondisjunction with aging of the ova.   We reviewed chromosomes, nondisjunction, and the associated 1 in 51 risk for fetal aneuploidy related to a maternal age of 40 y.o. at [redacted]w[redacted]d gestation.  She was counseled that the risk for aneuploidy decreases as gestational age increases, accounting for those pregnancies which spontaneously abort.  We specifically discussed Down syndrome (trisomy 96), trisomies 27 and 39, and sex chromosome aneuploidies (47,XXX and 47,XXY) including the common features and prognoses of each.   We reviewed available screening options including First Screen, Quad screen, noninvasive prenatal screening (NIPS)/cell free DNA (cfDNA) screening, and detailed ultrasound.  She was counseled that screening tests are used to modify a patient's a priori risk for aneuploidy, typically based on age. This estimate provides a pregnancy specific risk assessment. We reviewed the benefits and limitations of each option. Specifically, we discussed the conditions for which each test screens, the detection rates, and false positive rates of each. She was also counseled regarding diagnostic testing via [facility] and amniocentesis. We reviewed the approximate 1 in 99991111 risk for complications from amniocentesis, including spontaneous pregnancy loss. We discussed the possible results that the tests might provide including: positive, negative, unanticipated, and no result. Finally, she was counseled regarding the cost of each option and potential out of pocket expenses.    After consideration of all the options, she expressed interest in pursuing NIPS and nuchal translucency ultrasound at approximately [redacted] weeks gestation. Ms. Kasey declined diagnostic, invasive testing including [facility]S and amniocentesis.  She understands that screening tests cannot rule out all birth defects or genetic syndromes. The patient was advised of this limitation and states she still does not want additional testing at this time.   Both  family histories were reviewed and found to be contributory for history of  recurrent miscarriage for the patient and her partner. The patient reported a history of two early spontaneous abortions (approximately 5-6 weeks) between her younger daughter and son and then three early pregnancy losses after her son.  Approximately 1 in 6 confirmed pregnancies results in miscarriage. A single underlying cause is more likely to be suspected when a couple has experienced 3 or more losses. It is less likely that there will be an identifiable single underlying cause when a couple has experienced less than 3 losses. We reviewed chromosomes and examples of chromosome conditions. In approximately 3-8% of couples with recurrent pregnancy loss, one partner carries a chromosome variant, such as a balanced translocation. Being a carrier of a chromosome variant can increase the risk for abnormalities in the sperm or egg cell, which can increase the risk for miscarriage or the birth of a child with birth defects and/or intellectual disability. Additionally, we reviewed the maternal age related risk for aneuploidy due to nondisjunction, which can also increase the risk for pregnancy loss. We discussed that peripheral blood chromosome analysis is available to Shelley Hardy and her partner to assess for underlying chromosome rearrangement. This testing was not pursue today. We also discussed the option of scheduling MFM consultation to review additional possible available workup regarding recurrent miscarriage.   Shelley Hardy reported that her son has Glucose-6-Phosphate Dehydrogenase (G6PD) deficiency. He will be 42 years old in August and was diagnosed as an infant after having jaundice. Shelley Hardy reported no additional known relatives with G6PD deficiency. His primary pediatrician is [employer]. Glucose 6-phosphate dehydrogenase (G6PD) deficiency is an enzymatic disorder of red blood cells. It is an X-linked  genetic condition that encompasses a spectrum of hemolytic features. G6PD deficiency is most prevalent in [place], the [place], and [place], and it is estimated that over 400 million individuals have the condition. G6PD is an enzyme that is involved in maintaining the normal life span for red blood cells and for oxidizing processes. G6PD deficiency can lead to hemolysis. There are many variants of clinical expression of G6PD deficiency described and symptoms can include acute hemolytic anemia, favism, congenital nonspherocytic hemolytic anemia, and neonatal hyperbilirubinemia. The majority of individuals with G6PD deficiency are reported to typically be asymptomatic. Typically, for individuals with G6PD deficiency, episodes of hemolysis occur following some type of trigger. Possible triggers for hemolysis for individuals with G6PD deficiency include infections, drugs and chemicals (including primaquine and dapsone), diabetic ketoacidosis, and certain foods including fava beans. Clinical manifestations of G6PD deficiency depend upon the severity of the enzyme deficiency and the particular inciting event. The World Health Organization has classified the different G6PD variants according to the magnitude of the enzyme deficiency and severity of hemolysis with Class I being the most severe and having less than 10% of the typical G6PD enzyme. Management for G6PD may include avoidance of drugs or foods that cause hemolysis, blood transfusions, and splenectomy, depending on the severity and clinical features present.   We reviewed chromosomes and X-linked inheritance. We discussed that males typically have one X and one Y chromosome, and females typically have two X chromosomes. A female who has one nonworking copy of a gene pair that is on the X chromosome is typically referred to as a carrier. Carrier females are typically asymptomatic but can less commonly have symptoms due to skewed X-inactivation. Males  with a nonworking copy of a gene on the X chromosome would be considered to have the condition. In the case of G6PD deficiency,  not all males who have the nonworking gene change display symptoms of the condition. We discussed that in X-linked inheritance, each pregnancy for a carrier female has a 1 in 4 (25%) chance for each of the following: 1) a carrier female, 2) a female who is not a carrier and not affected, 3) a son who inherits the X-linked condition, 4) a son who does not inherit the nonworking gene change and would be unaffected. Thus, recurrence risk for the current pregnancy for a female with G6PD deficiency would be 1 in 4 (25%), based on the reported family history.   Molecular testing for causative genetic changes in the G6PD gene is available on a clinical basis. Given that there are numerous causative genetic changes described, testing is typically performed via gene sequencing. We discussed that it is most informative to begin genetic testing with an affected individual, such as the patient's son. However, Shelley Hardy would be expected to be an obligate carrier given the reported family history and could also have genetic testing, if desired. In the case that a causative gene change is identified in the family, prenatal diagnosis via amniocentesis would be available. We also discussed options to assess fetal sex including NIPS and second trimester detailed ultrasound, given the X-linked inheritance of G6PD to further refine risk assessment for the condition in the current pregnancy. Shelley Hardy understands that ultrasound does not diagnose or rule out genetic conditions or all birth defects. Shelley Hardy stated that she would NOT be interested in amniocentesis for G6PD.  Shelley Hardy indicated that she would prefer for postnatal assessment for G6PD deficiency to be coordinated at the appropriate time for this baby. It would be important for Shelley Hardy to discuss this history with her  pediatrician so that appropriate screening and management can be performed. G6PD enzyme testing is not typically reliable in the initial neonatal period. Without further information regarding the provided family history, an accurate genetic risk cannot be calculated. Further genetic counseling is warranted if more information is obtained.  Shelley Hardy was provided with written information regarding sickle cell anemia (SCA) including the carrier frequency and incidence in the African-American population, the availability of carrier testing and prenatal diagnosis if indicated.  In addition, we discussed that hemoglobinopathies are routinely screened for as part of the [place] newborn screening panel.  She previously had hemoglobin electrophoresis which was within normal range.  The father of the pregnancy is reportedly 29 years old. Advanced paternal age (APA) is defined as paternal age greater than or equal to age 67.  Recent large-scale sequencing studies have shown that approximately 80% of de novo point mutations are of paternal origin.  Many studies have demonstrated a strong correlation between increased paternal age and de novo point mutations.  Although no specific data is available regarding fetal risks for fathers 71+ years old at conception, it is apparent that the overall risk for single gene conditions is increased.  To estimate the relative increase in risk of a genetic disorder with APA, the heritability of the disease must be considered.  Assuming an approximate 2x increase in risk for conditions that are exclusively paternal in origin, the risk for each individual condition is still relatively low.  It is estimated that the overall chance for a de novo mutation is ~0.5%.  We also discussed the wide range of conditions which can be caused by new dominant gene mutations (achondroplasia, neurofibromatosis, Marfan syndrome etc.). Genetic testing for each individual single gene condition is  not  warranted or available unless ultrasound or family history concerns lend suspicion to a specific condition.   In addition, we discussed the recommendation for a detailed ultrasound at 18+ weeks gestation and a follow up ultrasound at ~28 weeks to monitor fetal growth.  Ms. Laughton denied exposure to environmental toxins or chemical agents. She denied the use of alcohol, tobacco or street drugs. She denied significant viral illnesses during the course of her pregnancy. Her medical and surgical histories were contributory for history of elevated prolactin levels. She reported that she is followed by endocrinology regarding this history.    I counseled Shelley Hardy regarding the above risks and available options.  The approximate face-to-face time with the genetic counselor was 50 minutes.  Chipper Oman, MS,  Certified Genetic Counselor 12/15/2015

## 2015-12-16 LAB — BETA HCG QUANT (REF LAB): HCG QUANT: 2340 m[IU]/mL

## 2015-12-19 ENCOUNTER — Telehealth: Payer: Self-pay | Admitting: *Deleted

## 2015-12-19 ENCOUNTER — Other Ambulatory Visit: Payer: Self-pay | Admitting: Certified Nurse Midwife

## 2015-12-19 NOTE — Telephone Encounter (Signed)
Patient wants lab results- does she need to repeat her quant or is that good?

## 2015-12-20 NOTE — Telephone Encounter (Signed)
Her quant has not doubled as expected.  Yes, we should probably repeat her quant.  Is she scheduled for an [place] anytime soon? Thanks R.Emma-Lee Oddo CNM

## 2015-12-21 ENCOUNTER — Other Ambulatory Visit: Payer: Self-pay

## 2015-12-21 ENCOUNTER — Ambulatory Visit (INDEPENDENT_AMBULATORY_CARE_PROVIDER_SITE_OTHER): Payer: Medicaid Other | Admitting: Certified Nurse Midwife

## 2015-12-21 ENCOUNTER — Other Ambulatory Visit: Payer: Self-pay | Admitting: Endocrinology

## 2015-12-21 ENCOUNTER — Telehealth: Payer: Self-pay | Admitting: Internal Medicine

## 2015-12-21 VITALS — BP 100/68 | HR 69 | Wt 174.0 lb

## 2015-12-21 DIAGNOSIS — O0991 Supervision of high risk pregnancy, unspecified, first trimester: Secondary | ICD-10-CM

## 2015-12-21 DIAGNOSIS — E229 Hyperfunction of pituitary gland, unspecified: Principal | ICD-10-CM

## 2015-12-21 DIAGNOSIS — R7989 Other specified abnormal findings of blood chemistry: Secondary | ICD-10-CM

## 2015-12-21 DIAGNOSIS — N96 Recurrent pregnancy loss: Secondary | ICD-10-CM

## 2015-12-21 DIAGNOSIS — O09521 Supervision of elderly multigravida, first trimester: Secondary | ICD-10-CM

## 2015-12-21 MED ORDER — BROMOCRIPTINE MESYLATE 2.5 MG PO TABS: 2.5000 mg | ORAL_TABLET | Freq: Every day | ORAL | Status: DC

## 2015-12-21 NOTE — Telephone Encounter (Signed)
I contacted the pt and Apolonio Schneiders with [facility] and advised of instructions below. Pt voiced understanding. F/u scheduled for 01/19/2016 and lab appointment scheduled for 01/16/2016. Medication sent to the pt's pharmacy.

## 2015-12-21 NOTE — Telephone Encounter (Signed)
[facility] calling to see if she can be put on bromocriptine or capargoline prolactin level is 68.1  She is 6 wks and 6 days pregnant and is spotting.  [phone] or [phone]

## 2015-12-21 NOTE — Telephone Encounter (Signed)
Please read message below and advise.  

## 2015-12-21 NOTE — Telephone Encounter (Signed)
She can take 2.5 mg of bromocriptine with dinner and see me in 4 weeks with labs for follow-up

## 2015-12-22 ENCOUNTER — Other Ambulatory Visit: Payer: Self-pay | Admitting: Certified Nurse Midwife

## 2015-12-22 ENCOUNTER — Telehealth: Payer: Self-pay | Admitting: *Deleted

## 2015-12-22 ENCOUNTER — Other Ambulatory Visit (HOSPITAL_COMMUNITY): Payer: Self-pay | Admitting: Maternal and Fetal Medicine

## 2015-12-22 ENCOUNTER — Ambulatory Visit: Payer: Medicaid Other | Admitting: Family Medicine

## 2015-12-22 ENCOUNTER — Ambulatory Visit (HOSPITAL_COMMUNITY)
Admission: RE | Admit: 2015-12-22 | Discharge: 2015-12-22 | Disposition: A | Discharge status: Home / Self Care | Payer: Medicaid Other | Source: Ambulatory Visit

## 2015-12-22 ENCOUNTER — Ambulatory Visit (HOSPITAL_COMMUNITY)
Admission: RE | Admit: 2015-12-22 | Discharge: 2015-12-22 | Disposition: A | Discharge status: Home / Self Care | Payer: Medicaid Other | Source: Ambulatory Visit | Attending: Certified Nurse Midwife | Admitting: Certified Nurse Midwife

## 2015-12-22 ENCOUNTER — Encounter (HOSPITAL_COMMUNITY): Payer: Self-pay

## 2015-12-22 DIAGNOSIS — O2621 Pregnancy care for patient with recurrent pregnancy loss, first trimester: Secondary | ICD-10-CM | POA: Insufficient documentation

## 2015-12-22 DIAGNOSIS — Z3A00 Weeks of gestation of pregnancy not specified: Secondary | ICD-10-CM | POA: Insufficient documentation

## 2015-12-22 DIAGNOSIS — O2620 Pregnancy care for patient with recurrent pregnancy loss, unspecified trimester: Secondary | ICD-10-CM

## 2015-12-22 DIAGNOSIS — Z36 Encounter for antenatal screening of mother: Secondary | ICD-10-CM | POA: Diagnosis not present

## 2015-12-22 DIAGNOSIS — O09521 Supervision of elderly multigravida, first trimester: Secondary | ICD-10-CM

## 2015-12-22 LAB — BETA HCG QUANT (REF LAB): HCG QUANT: 3581 m[IU]/mL

## 2015-12-22 NOTE — Telephone Encounter (Signed)
Patient has some questions- please call her.

## 2015-12-22 NOTE — Telephone Encounter (Signed)
Spoke with patient.  Patient will start bromocriptine this evening.  Reports brown spotting today.  Had [place] today.  No results yet.  Patient is worried that she is miscarrying again.  HCG levels rising very slowly.   Continue to monitor.   R.Manju Kulkarni CNM

## 2015-12-23 ENCOUNTER — Encounter (HOSPITAL_COMMUNITY): Payer: Medicaid Other

## 2015-12-23 ENCOUNTER — Telehealth: Payer: Self-pay | Admitting: *Deleted

## 2015-12-23 LAB — CHROMOSOME ANALYSIS, PERIPHERAL BLOOD

## 2015-12-23 NOTE — Telephone Encounter (Signed)
Pt called to office asking for return call.  Attempt to return call.  No answer, LM on VM to call office.

## 2015-12-24 MED ORDER — PROGESTERONE MICRONIZED 100 MG PO CAPS: 100.0000 mg | ORAL_CAPSULE | Freq: Every day | ORAL | Status: DC

## 2015-12-24 NOTE — Progress Notes (Signed)
Patient ID: Shelley Hardy, female   DOB: [birth year], 40 y.o.   MRN: [id number]  Chief Complaint  Patient presents with  . Follow-up    early pregnancy now bleeding    HPI Shelley Hardy is a 40 y.o. female.  Has had multiple miscarriages, 5 recently.  Reports brown discharge for the last two days.  Labs reviewed.  Endocrinology called for consult regarding bromocriptine, prolactin levels and pregnancy.  Genetics consult reviewed from MFM.  Has [place] scheduled for 12 weeks with MFM.  Discussed POC with patient in person and on the phone after visit.  Discussed patient in detail with endocrinology.  Agreed with plan to start on Bromocriptine.  Patient informed by myself and endocrinology to start on Bromocriptine.     HPI  Past Medical History  Diagnosis Date  . 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 AMA (advanced maternal age) multigravida 44+   . Depression     post partum    Past Surgical History  Procedure Laterality Date  . None      Family History  Problem Relation Age of Onset  . Anemia Cousin     patient's first cousin once removed; parents of cousin are first cousins to each other and to the patient  . Diabetes Maternal Grandmother   . Thyroid disease Maternal Aunt   . Glucose-6-phos deficiency Son     Social History Social History  Substance Use Topics  . Smoking status: Never Smoker   . Smokeless tobacco: Never Used  . Alcohol Use: No    No Known Allergies  Current Outpatient Prescriptions  Medication Sig Dispense Refill  . bromocriptine (PARLODEL) 2.5 MG tablet Take 1 tablet (2.5 mg total) by mouth daily. 30 tablet 2  . cabergoline (DOSTINEX) 0.5 MG tablet TAKE 1 TABLET BY MOUTH TWO TIMES A WEEK (Patient not taking: Reported on 11/15/2015) 10 tablet 0  . cabergoline (DOSTINEX) 0.5 MG tablet TAKE 1 TABLET BY MOUTH TWICE A WEEK (Patient not taking: Reported on 12/22/2015) 10 tablet 0  . docusate sodium (COLACE) 100 MG capsule Take 1 capsule (100 mg total) by mouth 2  (two) times daily. 60 capsule 12  . HYDROcodone-acetaminophen (NORCO/VICODIN) 5-325 MG per tablet Take 1-2 tablets by mouth every 4 (four) hours as needed. (Patient not taking: Reported on 11/15/2015) 10 tablet 0  . hydrocortisone (ANUSOL-HC) 25 MG suppository Place 1 suppository (25 mg total) rectally 2 (two) times daily. (Patient not taking: Reported on 12/22/2015) 12 suppository 0  . ibuprofen (ADVIL,MOTRIN) 600 MG tablet Take 1 tablet (600 mg total) by mouth every 8 (eight) hours as needed. (Patient not taking: Reported on 12/22/2015) 30 tablet 1  . nortriptyline (PAMELOR) 10 MG capsule Take 30 mg by mouth at bedtime. Reported on 12/22/2015    . Prenatal Vit-DSS-Fe Fum-FA (SE-NATAL 19) 29-1 MG TABS TAKE 1 TABLET BY MOUTH DAILY 30 tablet 0  . Prenatal Vit-Fe Fumarate-FA (PREPLUS) 27-1 MG TABS TAKE 1 TABLET BY MOUTH EVERY DAY 30 tablet 0  . terconazole (TERAZOL 7) 0.4 % vaginal cream Place 1 applicator vaginally at bedtime. (Patient not taking: Reported on 11/15/2015) 45 g 0   No current facility-administered medications for this visit.    Review of Systems Review of Systems Constitutional: negative for fatigue and weight loss Respiratory: negative for cough and wheezing Cardiovascular: negative for chest pain, fatigue and palpitations Gastrointestinal: negative for abdominal pain and change in bowel habits Genitourinary: + spotting Integument/breast: negative for nipple discharge Musculoskeletal:negative for myalgias Neurological: negative  for gait problems and tremors Behavioral/Psych: negative for abusive relationship, depression Endocrine: negative for temperature intolerance     Blood pressure 100/68, pulse 69, weight 174 lb (78.926 kg), last menstrual period 11/03/2015.  Physical Exam Physical Exam General:   alert  Skin:   no rash or abnormalities  Lungs:   clear to auscultation bilaterally  Heart:   regular rate and rhythm, S1, S2 normal, no murmur, click, rub or gallop  Breasts:    deferred  Abdomen:  normal findings: no organomegaly, soft, non-tender and no hernia  Pelvis:  deferred    50% of 15 min visit spent on counseling and coordination of care.   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 Beta HCG, Quant   No orders of the defined types were placed in this encounter.     Possible management options include: vaginal progesterone Follow up with NOB appointment in 4 weeks.

## 2015-12-29 ENCOUNTER — Ambulatory Visit: Payer: Medicaid Other | Admitting: "Endocrinology

## 2015-12-30 ENCOUNTER — Telehealth: Payer: Self-pay | Admitting: *Deleted

## 2015-12-30 NOTE — Telephone Encounter (Signed)
Pt called to office regarding Rx.  Return call to pt. Pt would like to know if there is any oral medication that she may take instead of using vaginal progesterone. Pt was advised that message would be sent to provider and to expect a call next week. Pt advised to continue with current Rx until reviewed by provider. Pt states understanding.  Please advise on progesterone.

## 2015-12-30 NOTE — Telephone Encounter (Signed)
She has a hx of 5 miscarriages, I am trying to keep her pregnant.  Right now, vaginal progesterone is the way to go.  Otherwise she maybe on 17-p injections depending on recommendations of MFM.  Thank you.  R.Javi Bollman CNM

## 2016-01-05 NOTE — Telephone Encounter (Signed)
Pt has been made aware to continue with vaginal progesterone. Pt advised to discuss with provider at appt next week.

## 2016-01-06 ENCOUNTER — Telehealth (HOSPITAL_COMMUNITY): Payer: Self-pay | Admitting: MS"

## 2016-01-06 ENCOUNTER — Other Ambulatory Visit (HOSPITAL_COMMUNITY): Payer: Self-pay

## 2016-01-06 NOTE — Telephone Encounter (Signed)
Attempted to call patient regarding results of peripheral blood chromosome analysis performed for her, which are within normal limits. Left message for patient to return call.   Shelley Hardy 01/06/2016 12:20 PM

## 2016-01-09 NOTE — Telephone Encounter (Signed)
Patient returned call. Reviewed that peripheral blood chromosome analysis for her was within normal limits, with no evidence of rearrangement. Discussed that peripheral blood chromosome analysis is also available to her husband, if desired. Additionally, maternal fetal medicine consultation is available to the patient to discuss additional possible explanations and possible available evaluations to assess for etiology for recurrent miscarriage.   Santiago Glad Beaulah Romanek

## 2016-01-10 ENCOUNTER — Encounter: Payer: Medicaid Other | Admitting: Certified Nurse Midwife

## 2016-01-16 ENCOUNTER — Other Ambulatory Visit: Payer: Medicaid Other

## 2016-01-19 ENCOUNTER — Ambulatory Visit: Payer: Medicaid Other | Admitting: Endocrinology

## 2016-01-19 DIAGNOSIS — Z0289 Encounter for other administrative examinations: Secondary | ICD-10-CM

## 2016-01-30 ENCOUNTER — Ambulatory Visit: Payer: Medicaid Other | Admitting: "Endocrinology

## 2016-02-14 ENCOUNTER — Other Ambulatory Visit: Payer: Self-pay | Admitting: Certified Nurse Midwife

## 2016-04-23 ENCOUNTER — Ambulatory Visit (INDEPENDENT_AMBULATORY_CARE_PROVIDER_SITE_OTHER): Payer: Medicaid Other

## 2016-04-23 ENCOUNTER — Other Ambulatory Visit: Payer: Self-pay | Admitting: Certified Nurse Midwife

## 2016-04-23 DIAGNOSIS — Z3202 Encounter for pregnancy test, result negative: Secondary | ICD-10-CM | POA: Diagnosis not present

## 2016-04-23 DIAGNOSIS — N96 Recurrent pregnancy loss: Secondary | ICD-10-CM

## 2016-04-23 DIAGNOSIS — Z3200 Encounter for pregnancy test, result unknown: Secondary | ICD-10-CM

## 2016-04-23 LAB — POCT URINE PREGNANCY: Preg Test, Ur: NEGATIVE

## 2016-04-23 NOTE — Progress Notes (Signed)
Pt. Was in office for possible pregnancy. UPT is negative, patient wants a blood pregnancy test done.

## 2016-04-24 LAB — BETA HCG QUANT (REF LAB): hCG Quant: <1 m[IU]/mL

## 2016-04-30 ENCOUNTER — Encounter: Payer: Self-pay | Admitting: *Deleted

## 2016-05-22 ENCOUNTER — Other Ambulatory Visit: Payer: Self-pay | Admitting: Obstetrics

## 2016-06-14 ENCOUNTER — Encounter: Payer: Self-pay | Admitting: *Deleted

## 2016-06-14 ENCOUNTER — Ambulatory Visit (INDEPENDENT_AMBULATORY_CARE_PROVIDER_SITE_OTHER): Payer: Medicaid Other | Admitting: *Deleted

## 2016-06-14 DIAGNOSIS — Z3201 Encounter for pregnancy test, result positive: Secondary | ICD-10-CM | POA: Diagnosis not present

## 2016-06-14 DIAGNOSIS — N912 Amenorrhea, unspecified: Secondary | ICD-10-CM

## 2016-06-14 LAB — POCT URINE PREGNANCY: Preg Test, Ur: POSITIVE — AB

## 2016-06-14 NOTE — Progress Notes (Signed)
[redacted]w[redacted]d EDD 01/30/2016 Patient has a history of 4 SAB  Patient is experiencing excessive spitting

## 2016-07-02 ENCOUNTER — Other Ambulatory Visit: Payer: Self-pay | Admitting: Endocrinology

## 2016-07-05 ENCOUNTER — Ambulatory Visit (INDEPENDENT_AMBULATORY_CARE_PROVIDER_SITE_OTHER): Payer: Medicaid Other | Admitting: Obstetrics and Gynecology

## 2016-07-05 ENCOUNTER — Other Ambulatory Visit (HOSPITAL_COMMUNITY)
Admission: RE | Admit: 2016-07-05 | Discharge: 2016-07-05 | Disposition: A | Discharge status: Home / Self Care | Payer: Medicaid Other | Source: Ambulatory Visit | Attending: Obstetrics and Gynecology | Admitting: Obstetrics and Gynecology

## 2016-07-05 ENCOUNTER — Encounter: Payer: Self-pay | Admitting: Obstetrics and Gynecology

## 2016-07-05 VITALS — BP 108/67 | HR 89 | Wt 179.6 lb

## 2016-07-05 DIAGNOSIS — Z01419 Encounter for gynecological examination (general) (routine) without abnormal findings: Secondary | ICD-10-CM | POA: Diagnosis present

## 2016-07-05 DIAGNOSIS — O09529 Supervision of elderly multigravida, unspecified trimester: Secondary | ICD-10-CM | POA: Insufficient documentation

## 2016-07-05 DIAGNOSIS — E049 Nontoxic goiter, unspecified: Secondary | ICD-10-CM

## 2016-07-05 DIAGNOSIS — Z113 Encounter for screening for infections with a predominantly sexual mode of transmission: Secondary | ICD-10-CM | POA: Diagnosis present

## 2016-07-05 DIAGNOSIS — O09521 Supervision of elderly multigravida, first trimester: Secondary | ICD-10-CM

## 2016-07-05 DIAGNOSIS — O2620 Pregnancy care for patient with recurrent pregnancy loss, unspecified trimester: Secondary | ICD-10-CM

## 2016-07-05 DIAGNOSIS — D352 Benign neoplasm of pituitary gland: Secondary | ICD-10-CM

## 2016-07-05 DIAGNOSIS — Z1151 Encounter for screening for human papillomavirus (HPV): Secondary | ICD-10-CM | POA: Diagnosis not present

## 2016-07-05 MED ORDER — BROMOCRIPTINE MESYLATE 2.5 MG PO TABS: 2.5000 mg | ORAL_TABLET | Freq: Every day | ORAL | 2 refills | Status: DC

## 2016-07-05 MED ORDER — ONDANSETRON 4 MG PO TBDP: 4.0000 mg | ORAL_TABLET | Freq: Four times a day (QID) | ORAL | 0 refills | Status: DC | PRN

## 2016-07-05 NOTE — Patient Instructions (Signed)
First Trimester of Pregnancy The first trimester of pregnancy is from week 1 until the end of week 12 (months 1 through 3). A week after a sperm fertilizes an egg, the egg will implant on the wall of the uterus. This embryo will begin to develop into a baby. Genes from you and your partner are forming the baby. The female genes determine whether the baby is a boy or a girl. At 6-8 weeks, the eyes and face are formed, and the heartbeat can be seen on ultrasound. At the end of 12 weeks, all the baby's organs are formed.  Now that you are pregnant, you will want to do everything you can to have a healthy baby. Two of the most important things are to get good prenatal care and to follow your health care provider's instructions. Prenatal care is all the medical care you receive before the baby's birth. This care will help prevent, find, and treat any problems during the pregnancy and childbirth. BODY CHANGES Your body goes through many changes during pregnancy. The changes vary from woman to woman.   You may gain or lose a couple of pounds at first.  You may feel sick to your stomach (nauseous) and throw up (vomit). If the vomiting is uncontrollable, call your health care provider.  You may tire easily.  You may develop headaches that can be relieved by medicines approved by your health care provider.  You may urinate more often. Painful urination may mean you have a bladder infection.  You may develop heartburn as a result of your pregnancy.  You may develop constipation because certain hormones are causing the muscles that push waste through your intestines to slow down.  You may develop hemorrhoids or swollen, bulging veins (varicose veins).  Your breasts may begin to grow larger and become tender. Your nipples may stick out more, and the tissue that surrounds them (areola) may become darker.  Your gums may bleed and may be sensitive to brushing and flossing.  Dark spots or blotches (chloasma,  mask of pregnancy) may develop on your face. This will likely fade after the baby is born.  Your menstrual periods will stop.  You may have a loss of appetite.  You may develop cravings for certain kinds of food.  You may have changes in your emotions from day to day, such as being excited to be pregnant or being concerned that something may go wrong with the pregnancy and baby.  You may have more vivid and strange dreams.  You may have changes in your hair. These can include thickening of your hair, rapid growth, and changes in texture. Some women also have hair loss during or after pregnancy, or hair that feels dry or thin. Your hair will most likely return to normal after your baby is born. WHAT TO EXPECT AT YOUR PRENATAL VISITS During a routine prenatal visit:  You will be weighed to make sure you and the baby are growing normally.  Your blood pressure will be taken.  Your abdomen will be measured to track your baby's growth.  The fetal heartbeat will be listened to starting around week 10 or 12 of your pregnancy.  Test results from any previous visits will be discussed. Your health care provider may ask you:  How you are feeling.  If you are feeling the baby move.  If you have had any abnormal symptoms, such as leaking fluid, bleeding, severe headaches, or abdominal cramping.  If you are using any tobacco products,   including cigarettes, chewing tobacco, and electronic cigarettes.  If you have any questions. Other tests that may be performed during your first trimester include:  Blood tests to find your blood type and to check for the presence of any previous infections. They will also be used to check for low iron levels (anemia) and Rh antibodies. Later in the pregnancy, blood tests for diabetes will be done along with other tests if problems develop.  Urine tests to check for infections, diabetes, or protein in the urine.  An ultrasound to confirm the proper growth  and development of the baby.  An amniocentesis to check for possible genetic problems.  Fetal screens for spina bifida and Down syndrome.  You may need other tests to make sure you and the baby are doing well.  HIV (human immunodeficiency virus) testing. Routine prenatal testing includes screening for HIV, unless you choose not to have this test. HOME CARE INSTRUCTIONS  Medicines   Follow your health care provider's instructions regarding medicine use. Specific medicines may be either safe or unsafe to take during pregnancy.  Take your prenatal vitamins as directed.  If you develop constipation, try taking a stool softener if your health care provider approves. Diet   Eat regular, well-balanced meals. Choose a variety of foods, such as meat or vegetable-based protein, fish, milk and low-fat dairy products, vegetables, fruits, and whole grain breads and cereals. Your health care provider will help you determine the amount of weight gain that is right for you.  Avoid raw meat and uncooked cheese. These carry germs that can cause birth defects in the baby.  Eating four or five small meals rather than three large meals a day may help relieve nausea and vomiting. If you start to feel nauseous, eating a few soda crackers can be helpful. Drinking liquids between meals instead of during meals also seems to help nausea and vomiting.  If you develop constipation, eat more high-fiber foods, such as fresh vegetables or fruit and whole grains. Drink enough fluids to keep your urine clear or pale yellow. Activity and Exercise   Exercise only as directed by your health care provider. Exercising will help you:  Control your weight.  Stay in shape.  Be prepared for labor and delivery.  Experiencing pain or cramping in the lower abdomen or low back is a good sign that you should stop exercising. Check with your health care provider before continuing normal exercises.  Try to avoid standing for  long periods of time. Move your legs often if you must stand in one place for a long time.  Avoid heavy lifting.  Wear low-heeled shoes, and practice good posture.  You may continue to have sex unless your health care provider directs you otherwise. Relief of Pain or Discomfort   Wear a good support bra for breast tenderness.   Take warm sitz baths to soothe any pain or discomfort caused by hemorrhoids. Use hemorrhoid cream if your health care provider approves.   Rest with your legs elevated if you have leg cramps or low back pain.  If you develop varicose veins in your legs, wear support hose. Elevate your feet for 15 minutes, 3-4 times a day. Limit salt in your diet. Prenatal Care   Schedule your prenatal visits by the twelfth week of pregnancy. They are usually scheduled monthly at first, then more often in the last 2 months before delivery.  Write down your questions. Take them to your prenatal visits.  Keep all your prenatal  visits as directed by your health care provider. Safety   Wear your seat belt at all times when driving.  Make a list of emergency phone numbers, including numbers for family, friends, the hospital, and police and fire departments. General Tips   Ask your health care provider for a referral to a local prenatal education class. Begin classes no later than at the beginning of month 6 of your pregnancy.  Ask for help if you have counseling or nutritional needs during pregnancy. Your health care provider can offer advice or refer you to specialists for help with various needs.  Do not use hot tubs, steam rooms, or saunas.  Do not douche or use tampons or scented sanitary pads.  Do not cross your legs for long periods of time.  Avoid cat litter boxes and soil used by cats. These carry germs that can cause birth defects in the baby and possibly loss of the fetus by miscarriage or stillbirth.  Avoid all smoking, herbs, alcohol, and medicines not  prescribed by your health care provider. Chemicals in these affect the formation and growth of the baby.  Do not use any tobacco products, including cigarettes, chewing tobacco, and electronic cigarettes. If you need help quitting, ask your health care provider. You may receive counseling support and other resources to help you quit.  Schedule a dentist appointment. At home, brush your teeth with a soft toothbrush and be gentle when you floss. SEEK MEDICAL CARE IF:   You have dizziness.  You have mild pelvic cramps, pelvic pressure, or nagging pain in the abdominal area.  You have persistent nausea, vomiting, or diarrhea.  You have a bad smelling vaginal discharge.  You have pain with urination.  You notice increased swelling in your face, hands, legs, or ankles. SEEK IMMEDIATE MEDICAL CARE IF:   You have a fever.  You are leaking fluid from your vagina.  You have spotting or bleeding from your vagina.  You have severe abdominal cramping or pain.  You have rapid weight gain or loss.  You vomit blood or material that looks like coffee grounds.  You are exposed to [place] measles and have never had them.  You are exposed to fifth disease or chickenpox.  You develop a severe headache.  You have shortness of breath.  You have any kind of trauma, such as from a fall or a car accident. This information is not intended to replace advice given to you by your health care provider. Make sure you discuss any questions you have with your health care provider. Document Released: 06/26/2001 Document Revised: 07/23/2014 Document Reviewed: 05/12/2013 Elsevier Interactive Patient Education  2017 [employer]. Heartburn During Pregnancy Heartburn is a type of pain or discomfort that can happen in the throat or chest. It is often described as a burning sensation. Heartburn is common during pregnancy because:  A hormone (progesterone) that is released during pregnancy may relax the valve  (lower esophageal sphincter, or LES) that separates the esophagus from the stomach. This allows stomach acid to move up into the esophagus, causing heartburn.  The uterus gets larger and pushes up on the stomach, which pushes more acid into the esophagus. This is especially true in the later stages of pregnancy. Heartburn usually goes away or gets better after giving birth. What are the causes? Heartburn is caused by stomach acid backing up into the esophagus (reflux). Reflux can be triggered by:  Changing hormone levels.  Large meals.  Certain foods and beverages, such as  coffee, chocolate, onions, and peppermint.  Exercise.  Increased stomach acid production. What increases the risk? You are more likely to experience heartburn during pregnancy if you:  Had heartburn prior to becoming pregnant.  Have been pregnant more than once before.  Are overweight or obese. The likelihood that you will get heartburn also increases as you get farther along in your pregnancy, especially during the last trimester. What are the signs or symptoms? Symptoms of this condition include:  Burning pain in the chest or lower throat.  Bitter taste in the mouth.  Coughing.  Problems swallowing.  Vomiting.  Hoarse voice.  Asthma. Symptoms may get worse when you lie down or bend over. Symptoms are often worse at night. How is this diagnosed? This condition is diagnosed based on:  Your medical history.  Your symptoms.  Blood tests to check for a certain type of bacteria associated with heartburn.  Whether taking heartburn medicine relieves your symptoms.  Examination of the stomach and esophagus using a tube with a light and camera on the end (endoscopy). How is this treated? Treatment varies depending on how severe your symptoms are. Your health care provider may recommend:  Over-the-counter medicines (antacids or acid reducers) for mild heartburn.  Prescription medicines to decrease  stomach acid or to protect your stomach lining.  Certain changes in your diet.  Raising the head of your bed so it is higher than the foot of the bed. This helps prevent stomach acid from backing up into the esophagus when you are lying down. Follow these instructions at home: Eating and drinking  Do not drink alcohol during your pregnancy.  Identify foods and beverages that make your symptoms worse, and avoid them.  Beverages that you may want to avoid include:  Coffee and tea (with or without caffeine).  Energy drinks and sports drinks.  Carbonated drinks or sodas.  Citrus fruit juices.  Foods that you may want to avoid include:  Chocolate and cocoa.  Peppermint and mint flavorings.  Garlic, onions, and horseradish.  Spicy and acidic foods, including peppers, chili powder, curry powder, vinegar, hot sauces, and barbecue sauce.  Citrus fruits, such as oranges, lemons, and limes.  Tomato-based foods, such as red sauce, chili, and salsa.  Fried and fatty foods, such as donuts, french fries, potato chips, and high-fat dressings.  High-fat meats, such as hot dogs, cold cuts, sausage, ham, and bacon.  High-fat dairy items, such as whole milk, butter, and cheese.  Eat small, frequent meals instead of large meals.  Avoid drinking large amounts of liquid with your meals.  Avoid eating meals during the 2-3 hours before bedtime.  Avoid lying down right after you eat.  Do not exercise right after you eat. Medicines  Take over-the-counter and prescription medicines only as told by your health care provider.  Do not take aspirin, ibuprofen, or other NSAIDs unless your health care provider tells you to do that.  You may be instructed to avoid medicines that contain sodium bicarbonate. General instructions  If directed, raise the head of your bed about 6 inches (15 cm) by putting blocks under the legs. Sleeping with more pillows does not effectively relieve heartburn  because it only changes the position of your head.  Do not use any products that contain nicotine or tobacco, such as cigarettes and e-cigarettes. If you need help quitting, ask your health care provider.  Wear loose-fitting clothing.  Try to reduce your stress, such as with yoga or meditation. If you need  help managing stress, ask your health care provider.  Maintain a healthy weight. If you are overweight, work with your health care provider to safely lose weight.  Keep all follow-up visits as told by your health care provider. This is important. Contact a health care provider if:  You develop new symptoms.  Your symptoms do not improve with treatment.  You have unexplained weight loss.  You have difficulty swallowing.  You make loud sounds when you breathe (wheeze).  You have a cough that does not go away.  You have frequent heartburn for more than 2 weeks.  You have nausea or vomiting that does not get better with treatment.  You have pain in your abdomen. Get help right away if:  You have severe chest pain that spreads to your arm, neck, or jaw.  You feel sweaty, dizzy, or light-headed.  You have shortness of breath.  You have pain when swallowing.  You vomit, and your vomit looks like blood or coffee grounds.  Your stool is bloody or black. This information is not intended to replace advice given to you by your health care provider. Make sure you discuss any questions you have with your health care provider. Document Released: 06/29/2000 Document Revised: 03/19/2016 Document Reviewed: 03/19/2016 Elsevier Interactive Patient Education  2017 [employer].

## 2016-07-05 NOTE — Progress Notes (Signed)
Patient is having very bad heartburn. She has excessive salvia.

## 2016-07-05 NOTE — Progress Notes (Signed)
Subjective:  Shelley Hardy is a 40 y.o. [id number] at [redacted]w[redacted]d being seen today for her first prenatal visit with this pregnancy. 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 Followed by Endocrine. Last seen this past summer. Last MRI 12/16 with stable tumor size. Last Prolactin was 68 in May.  She also has a H/O goiter with normal thyroid function. Habitual aborter with negative maternal chromosomes.  She is currently monitored for the following issues for this high-risk pregnancy and has Prolactinoma ([place]); Goiter; H/O: pituitary tumor; Depression; Hypertriglyceridemia; AMA (advanced maternal age) multigravida 68+; Supervision of elderly multigravida (>=28 years old at time of delivery); and Aborter, habitual, antepartum on her problem list.  Patient reports nausea.  Contractions: Not present. Vag. Bleeding: None.   . Denies leaking of fluid.   The following portions of the patient's history were reviewed and updated as appropriate: allergies, current medications, past family history, past medical history, past social history, past surgical history and problem list. Problem list updated.  Objective:   Vitals:   07/05/16 0849  BP: 108/67  Pulse: 89  Weight: 179 lb 9.6 oz (81.5 kg)    Fetal Status: Fetal Heart Rate (bpm): 160         General:  Alert, oriented and cooperative. Patient is in no acute distress.  Skin: Skin is warm and dry. No rash noted.   Cardiovascular: Normal heart rate noted  Respiratory: Normal respiratory effort, no problems with respiration noted  Abdomen: Soft, gravid, appropriate for gestational age. Pain/Pressure: Absent     Pelvic:  Cervical exam performed        Extremities: Normal range of motion.  Edema: None  Mental Status: Normal mood and affect. Normal behavior. Normal judgment and thought content.  Breast sym supple, no nipple d/c, masses or adenopathy Urinalysis:      Assessment and Plan:  Pregnancy: [id number] at [redacted]w[redacted]d  1. Prolactinoma  ([place]) Pt to make appt to f/u with enocrine - Prolactin - bromocriptine (PARLODEL) 2.5 MG tablet; Take 1 tablet (2.5 mg total) by mouth daily.  Dispense: 30 tablet; Refill: 2  2. Goiter  - TSH  3. Elderly multigravida in first trimester   4. Encounter for supervision of multigravida of advanced maternal age in first trimester AMA reviewed with pt. Genetic screening ordered - HIV antibody - Hemoglobinopathy evaluation - Varicella zoster antibody, IgG - Prenatal Profile I - Culture, OB Urine - ToxASSURE Select 13 (MW), Urine - GC/Chlamydia probe amp ([place])not at [facility] - Hemoglobin A1c - Cytology - PAP - [place] MFM Fetal Nuchal Translucency; Future - MaterniT21 PLUS Core - ondansetron (ZOFRAN ODT) 4 MG disintegrating tablet; Take 1 tablet (4 mg total) by mouth every 6 (six) hours as needed for nausea.  Dispense: 20 tablet; Refill: 0  5. Aborter, habitual, antepartum   Preterm labor symptoms and general obstetric precautions including but not limited to vaginal bleeding, contractions, leaking of fluid and fetal movement were reviewed in detail with the patient. Please refer to After Visit Summary for other counseling recommendations.  Return in about 4 weeks (around 08/02/2016) for OB visit.   Chancy Milroy, MD

## 2016-07-06 LAB — GC/CHLAMYDIA PROBE AMP (~~LOC~~) NOT AT ARMC
CHLAMYDIA, DNA PROBE: NEGATIVE
NEISSERIA GONORRHEA: NEGATIVE

## 2016-07-11 ENCOUNTER — Telehealth: Payer: Self-pay

## 2016-07-11 DIAGNOSIS — R110 Nausea: Secondary | ICD-10-CM

## 2016-07-11 LAB — CYTOLOGY - PAP
DIAGNOSIS: NEGATIVE
HPV: NOT DETECTED

## 2016-07-11 MED ORDER — PROMETHAZINE HCL 25 MG PO TABS: 25.0000 mg | ORAL_TABLET | Freq: Four times a day (QID) | ORAL | 2 refills | Status: DC | PRN

## 2016-07-11 NOTE — Telephone Encounter (Signed)
Returned call, patient stated that zofran is not working for nausea and that she needs rx for heartburn also, routed to provider for review.

## 2016-07-11 NOTE — Telephone Encounter (Signed)
Contacted patient and informed about rx sent by provider

## 2016-07-15 LAB — HEMOGLOBINOPATHY EVALUATION
HEMOGLOBIN F QUANTITATION: 0 % (ref 0.0–2.0)
HGB A: 97.2 % (ref 96.4–98.8)
HGB C: 0 %
HGB S: 0 %
HGB VARIANT: 0 %
Hemoglobin A2 Quantitation: 2.8 % (ref 1.8–3.2)

## 2016-07-15 LAB — MATERNIT 21 PLUS CORE, BLOOD
CHROMOSOME 13: NEGATIVE
Chromosome 18: NEGATIVE
Chromosome 21: NEGATIVE
PDF: 0

## 2016-07-15 LAB — PROLACTIN: Prolactin: 291 ng/mL — ABNORMAL HIGH (ref 4.8–23.3)

## 2016-07-15 LAB — HEMOGLOBIN A1C
ESTIMATED AVERAGE GLUCOSE: 88 mg/dL
Hgb A1c MFr Bld: 4.7 % — ABNORMAL LOW (ref 4.8–5.6)

## 2016-07-15 LAB — PRENATAL PROFILE I(LABCORP)
Antibody Screen: NEGATIVE
BASOS ABS: 0 10*3/uL (ref 0.0–0.2)
Basos: 0 %
EOS (ABSOLUTE): 0.1 10*3/uL (ref 0.0–0.4)
EOS: 3 %
HEP B S AG: NEGATIVE
Hematocrit: 35.3 % (ref 34.0–46.6)
Hemoglobin: 11.8 g/dL (ref 11.1–15.9)
IMMATURE GRANS (ABS): 0 10*3/uL (ref 0.0–0.1)
IMMATURE GRANULOCYTES: 0 %
LYMPHS ABS: 1.9 10*3/uL (ref 0.7–3.1)
LYMPHS: 36 %
MCH: 29.2 pg (ref 26.6–33.0)
MCHC: 33.4 g/dL (ref 31.5–35.7)
MCV: 87 fL (ref 79–97)
Monocytes Absolute: 0.5 10*3/uL (ref 0.1–0.9)
Monocytes: 9 %
NEUTROS PCT: 52 %
Neutrophils Absolute: 2.7 10*3/uL (ref 1.4–7.0)
PLATELETS: 200 10*3/uL (ref 150–379)
RBC: 4.04 x10E6/uL (ref 3.77–5.28)
RDW: 12.8 % (ref 12.3–15.4)
RPR Ser Ql: NONREACTIVE
Rh Factor: POSITIVE
Rubella Antibodies, IGG: 5.2 index (ref >0.99)
WBC: 5.2 10*3/uL (ref 3.4–10.8)

## 2016-07-15 LAB — VARICELLA ZOSTER ANTIBODY, IGG: VARICELLA: 986 {index} (ref >165)

## 2016-07-15 LAB — TSH: TSH: 0.022 u[IU]/mL — ABNORMAL LOW (ref 0.450–4.500)

## 2016-07-15 LAB — HIV ANTIBODY (ROUTINE TESTING W REFLEX): HIV SCREEN 4TH GENERATION: NONREACTIVE

## 2016-07-16 NOTE — L&D Delivery Note (Signed)
Delivery Note Patient delivered after induction of labor w/ cytotec and pitosin and SROM  At 4:15 PM a viable female was delivered via Induced VD (Presentation: OA).  APGAR: 9, 9; weight pending.   Placenta status: Intact, Cord: 3v with the following complications: None.  Cord pH: Not obtained  Anesthesia: Epidural Episiotomy: None Lacerations: None Suture Repair: None. Est. Blood Loss (mL): 100  Mom to postpartum.  Baby to Couplet care / Skin to Skin.  Patient was delivered by Milana Kidney, MS3 under the supervision of Dr. Laurey Arrow.  Aneta Mins 01/29/2017, 4:31 PM

## 2016-07-17 LAB — TOXASSURE SELECT 13 (MW), URINE

## 2016-07-18 ENCOUNTER — Other Ambulatory Visit: Payer: Self-pay | Admitting: Obstetrics

## 2016-07-19 ENCOUNTER — Telehealth: Payer: Self-pay | Admitting: *Deleted

## 2016-07-19 NOTE — Telephone Encounter (Signed)
Pt made aware of lab results.  Pt states that she is currently taking medication in question. Pt states that she has not seen Endocrinology, she thinks it waste her time.  Pt encouraged to have appt in order to manage properly. Pt states understanding.

## 2016-07-26 ENCOUNTER — Ambulatory Visit (HOSPITAL_COMMUNITY): Payer: Medicaid Other

## 2016-07-26 ENCOUNTER — Encounter (HOSPITAL_COMMUNITY): Payer: Self-pay

## 2016-07-26 ENCOUNTER — Other Ambulatory Visit: Payer: Self-pay | Admitting: Obstetrics and Gynecology

## 2016-07-26 ENCOUNTER — Ambulatory Visit (HOSPITAL_COMMUNITY)
Admission: RE | Admit: 2016-07-26 | Discharge: 2016-07-26 | Disposition: A | Discharge status: Home / Self Care | Payer: Medicaid Other | Source: Ambulatory Visit | Attending: Obstetrics and Gynecology | Admitting: Obstetrics and Gynecology

## 2016-07-26 ENCOUNTER — Ambulatory Visit (HOSPITAL_COMMUNITY): Admission: RE | Admit: 2016-07-26 | Payer: Medicaid Other | Source: Ambulatory Visit

## 2016-07-26 DIAGNOSIS — Z3682 Encounter for antenatal screening for nuchal translucency: Secondary | ICD-10-CM | POA: Insufficient documentation

## 2016-07-26 DIAGNOSIS — E669 Obesity, unspecified: Secondary | ICD-10-CM

## 2016-07-26 DIAGNOSIS — O09521 Supervision of elderly multigravida, first trimester: Secondary | ICD-10-CM | POA: Diagnosis not present

## 2016-07-26 DIAGNOSIS — Z3A13 13 weeks gestation of pregnancy: Secondary | ICD-10-CM

## 2016-07-26 DIAGNOSIS — O99211 Obesity complicating pregnancy, first trimester: Secondary | ICD-10-CM | POA: Diagnosis present

## 2016-07-27 ENCOUNTER — Other Ambulatory Visit (HOSPITAL_COMMUNITY): Payer: Self-pay | Admitting: *Deleted

## 2016-07-27 DIAGNOSIS — O09522 Supervision of elderly multigravida, second trimester: Secondary | ICD-10-CM

## 2016-08-02 ENCOUNTER — Encounter: Payer: Medicaid Other | Admitting: Family Medicine

## 2016-08-07 ENCOUNTER — Telehealth: Payer: Self-pay | Admitting: Endocrinology

## 2016-08-07 NOTE — Telephone Encounter (Signed)
Please refuse with note to have patient make appointment

## 2016-08-07 NOTE — Telephone Encounter (Signed)
Patient requesting refill on cabergoline last office visit was 07/07/15 has 4 no shows and no future appointment scheduled should I refill please advise

## 2016-08-07 NOTE — Telephone Encounter (Signed)
Done on 08/07/16

## 2016-08-09 ENCOUNTER — Encounter (HOSPITAL_COMMUNITY): Payer: Self-pay

## 2016-08-09 ENCOUNTER — Inpatient Hospital Stay (HOSPITAL_COMMUNITY)
Admission: AD | Admit: 2016-08-09 | Discharge: 2016-08-09 | Disposition: A | Discharge status: Home / Self Care | Payer: Medicaid Other | Source: Ambulatory Visit | Attending: Obstetrics and Gynecology | Admitting: Obstetrics and Gynecology

## 2016-08-09 DIAGNOSIS — Z79899 Other long term (current) drug therapy: Secondary | ICD-10-CM | POA: Insufficient documentation

## 2016-08-09 DIAGNOSIS — O99612 Diseases of the digestive system complicating pregnancy, second trimester: Secondary | ICD-10-CM | POA: Diagnosis not present

## 2016-08-09 DIAGNOSIS — Z3A15 15 weeks gestation of pregnancy: Secondary | ICD-10-CM

## 2016-08-09 DIAGNOSIS — F329 Major depressive disorder, single episode, unspecified: Secondary | ICD-10-CM | POA: Insufficient documentation

## 2016-08-09 DIAGNOSIS — K5900 Constipation, unspecified: Secondary | ICD-10-CM

## 2016-08-09 DIAGNOSIS — O99342 Other mental disorders complicating pregnancy, second trimester: Secondary | ICD-10-CM | POA: Diagnosis not present

## 2016-08-09 LAB — URINALYSIS, ROUTINE W REFLEX MICROSCOPIC
Bilirubin Urine: NEGATIVE
GLUCOSE, UA: NEGATIVE mg/dL
HGB URINE DIPSTICK: NEGATIVE
KETONES UR: NEGATIVE mg/dL
LEUKOCYTES UA: NEGATIVE
Nitrite: NEGATIVE
PROTEIN: NEGATIVE mg/dL
Specific Gravity, Urine: 1.012 (ref 1.005–1.030)
pH: 6 (ref 5.0–8.0)

## 2016-08-09 MED ORDER — FLEET ENEMA 7-19 GM/118ML RE ENEM: 1.0000 | ENEMA | Freq: Once | RECTAL | Status: DC

## 2016-08-09 MED ORDER — DOCUSATE SODIUM 100 MG PO CAPS: 100.0000 mg | ORAL_CAPSULE | Freq: Two times a day (BID) | ORAL | 0 refills | Status: DC

## 2016-08-09 NOTE — MAU Provider Note (Signed)
History     CSN: [id number]  Arrival date and time: 08/09/16 [id number]   First Provider Initiated Contact with Patient 08/09/16 2018      Chief Complaint  Patient presents with  . Constipation   Constipation  This is a new problem. The current episode started in the past 7 days. The problem is unchanged. Her stool frequency is 1 time per week or less. The stool is described as pellet like and formed. The patient is not on a high fiber diet. She exercises regularly. There has been adequate water intake (6-7 glasses per day ). Associated symptoms include abdominal pain and nausea. Pertinent negatives include no fever or vomiting. Risk factors: pregnancy  She has tried laxatives ("some tea") for the symptoms. The treatment provided no relief.   Past Medical History:  Diagnosis Date  . AMA (advanced maternal age) multigravida 68+   . Depression    post partum  . Pituitary tumor     Past Surgical History:  Procedure Laterality Date  . none      Family History  Problem Relation Age of Onset  . Anemia Cousin     patient's first cousin once removed; parents of cousin are first cousins to each other and to the patient  . Diabetes Maternal Grandmother   . Thyroid disease Maternal Aunt   . Glucose-6-phos deficiency Son     Social History  Substance Use Topics  . Smoking status: Never Smoker  . Smokeless tobacco: Never Used  . Alcohol use No    Allergies: No Known Allergies  Prescriptions Prior to Admission  Medication Sig Dispense Refill Last Dose  . bromocriptine (PARLODEL) 2.5 MG tablet Take 1 tablet (2.5 mg total) by mouth daily. 30 tablet 2 Taking  . ondansetron (ZOFRAN ODT) 4 MG disintegrating tablet Take 1 tablet (4 mg total) by mouth every 6 (six) hours as needed for nausea. 20 tablet 0 Taking  . Prenatal Vit-DSS-Fe Fum-FA (SE-NATAL 19) 29-1 MG TABS TAKE 1 TABLET BY MOUTH DAILY 30 tablet 0 Taking  . Prenatal Vit-Fe Fumarate-FA (PREPLUS) 27-1 MG TABS TAKE 1 TABLET BY MOUTH  EVERY DAY (Patient not taking: Reported on 07/26/2016) 30 tablet 0 Not Taking  . promethazine (PHENERGAN) 25 MG tablet Take 1 tablet (25 mg total) by mouth every 6 (six) hours as needed for nausea or vomiting. (Patient not taking: Reported on 07/26/2016) 20 tablet 2 Not Taking    Review of Systems  Constitutional: Negative for chills and fever.  Gastrointestinal: Positive for abdominal pain, constipation and nausea. Negative for vomiting.  Genitourinary: Negative for vaginal bleeding and vaginal discharge.   Physical Exam   Blood pressure 117/60, pulse (!) 52, temperature 98.8 F (37.1 C), temperature source Oral, resp. rate 16, height 5\' 6"  (1.676 m), weight 185 lb (83.9 kg), last menstrual period 04/24/2016, SpO2 100 %, unknown if currently breastfeeding.  Physical Exam  Nursing note and vitals reviewed. Constitutional: She is oriented to person, place, and time. She appears well-developed and well-nourished. No distress.  HENT:  Head: Normocephalic.  Cardiovascular: Normal rate.   Respiratory: Effort normal.  GI: Soft. There is no tenderness. There is no rebound.  Genitourinary:  Genitourinary Comments: FHT: 144 with doppler Uterus: AGA Cervix closed/thick Large amount of stool in the rectum, palpated during bimanual exam.   Neurological: She is alert and oriented to person, place, and time.  Skin: Skin is warm and dry.  Psychiatric: She has a normal mood and affect.    MAU Course  Procedures  MDM Patient has had soap suds enema. She reports results, and feeling better.    Assessment and Plan   1. Constipation, unspecified constipation type   2. [redacted] weeks gestation of pregnancy    DC home Comfort measures reviewed  2nd Trimester precautions  RX: colace BID #60  Return to MAU as needed FU with OB as planned  [place] for [place] Follow up.   Specialty:  Obstetrics and Gynecology Contact information: [place] [phone]           Mathis Bud 08/09/2016, 8:20 PM

## 2016-08-09 NOTE — Discharge Instructions (Signed)
Constipation, Adult °Constipation is when a person: °· Poops (has a bowel movement) fewer times in a week than normal. °· Has a hard time pooping. °· Has poop that is dry, hard, or bigger than normal. ° °Follow these instructions at home: °Eating and drinking ° °· Eat foods that have a lot of fiber, such as: °? Fresh fruits and vegetables. °? Whole grains. °? Beans. °· Eat less of foods that are high in fat, low in fiber, or overly processed, such as: °? French fries. °? Hamburgers. °? Cookies. °? Candy. °? Soda. °· Drink enough fluid to keep your pee (urine) clear or pale yellow. °General instructions °· Exercise regularly or as told by your doctor. °· Go to the restroom when you feel like you need to poop. Do not hold it in. °· Take over-the-counter and prescription medicines only as told by your doctor. These include any fiber supplements. °· Do pelvic floor retraining exercises, such as: °? Doing deep breathing while relaxing your lower belly (abdomen). °? Relaxing your pelvic floor while pooping. °· Watch your condition for any changes. °· Keep all follow-up visits as told by your doctor. This is important. °Contact a doctor if: °· You have pain that gets worse. °· You have a fever. °· You have not pooped for 4 days. °· You throw up (vomit). °· You are not hungry. °· You lose weight. °· You are bleeding from the anus. °· You have thin, pencil-like poop (stool). °Get help right away if: °· You have a fever, and your symptoms suddenly get worse. °· You leak poop or have blood in your poop. °· Your belly feels hard or bigger than normal (is bloated). °· You have very bad belly pain. °· You feel dizzy or you faint. °This information is not intended to replace advice given to you by your health care provider. Make sure you discuss any questions you have with your health care provider. °Document Released: 12/19/2007 Document Revised: 01/20/2016 Document Reviewed: 12/21/2015 °Elsevier Interactive Patient Education ©  2017 [employer] ° °

## 2016-08-09 NOTE — MAU Note (Signed)
Pt reports have a small BM after the enema

## 2016-08-09 NOTE — MAU Note (Signed)
Pt reports cramping and she is constipated. States she has not had a BM in 2 days and has been taking fiber but it isn't helping. Denies bleeding.

## 2016-08-13 ENCOUNTER — Encounter: Payer: Medicaid Other | Admitting: Obstetrics and Gynecology

## 2016-08-29 ENCOUNTER — Ambulatory Visit (HOSPITAL_COMMUNITY)
Admission: RE | Admit: 2016-08-29 | Discharge: 2016-08-29 | Disposition: A | Discharge status: Home / Self Care | Payer: Medicaid Other | Source: Ambulatory Visit | Attending: Obstetrics and Gynecology | Admitting: Obstetrics and Gynecology

## 2016-08-29 ENCOUNTER — Encounter (HOSPITAL_COMMUNITY): Payer: Self-pay

## 2016-08-29 DIAGNOSIS — O09522 Supervision of elderly multigravida, second trimester: Secondary | ICD-10-CM | POA: Insufficient documentation

## 2016-08-29 DIAGNOSIS — Z3A18 18 weeks gestation of pregnancy: Secondary | ICD-10-CM | POA: Diagnosis not present

## 2016-08-29 DIAGNOSIS — O2622 Pregnancy care for patient with recurrent pregnancy loss, second trimester: Secondary | ICD-10-CM | POA: Insufficient documentation

## 2016-08-29 DIAGNOSIS — Z363 Encounter for antenatal screening for malformations: Secondary | ICD-10-CM | POA: Diagnosis not present

## 2016-08-30 ENCOUNTER — Other Ambulatory Visit (HOSPITAL_COMMUNITY): Payer: Self-pay | Admitting: *Deleted

## 2016-08-30 DIAGNOSIS — O09522 Supervision of elderly multigravida, second trimester: Secondary | ICD-10-CM

## 2016-08-31 ENCOUNTER — Telehealth: Payer: Self-pay

## 2016-08-31 NOTE — Telephone Encounter (Signed)
error 

## 2016-09-18 ENCOUNTER — Encounter: Payer: Medicaid Other | Admitting: Obstetrics and Gynecology

## 2016-09-19 ENCOUNTER — Ambulatory Visit (INDEPENDENT_AMBULATORY_CARE_PROVIDER_SITE_OTHER): Payer: Medicaid Other | Admitting: Obstetrics & Gynecology

## 2016-09-19 VITALS — BP 102/58 | HR 54 | Wt 185.0 lb

## 2016-09-19 DIAGNOSIS — O09529 Supervision of elderly multigravida, unspecified trimester: Secondary | ICD-10-CM

## 2016-09-19 DIAGNOSIS — Z87898 Personal history of other specified conditions: Secondary | ICD-10-CM

## 2016-09-19 DIAGNOSIS — O09522 Supervision of elderly multigravida, second trimester: Secondary | ICD-10-CM

## 2016-09-19 NOTE — Progress Notes (Signed)
   PRENATAL VISIT NOTE  Subjective:  Shelley Hardy is a 41 y.o. [phone] at [redacted]w[redacted]d being seen today for ongoing prenatal care.  She is currently monitored for the following issues for this high-risk pregnancy and has Prolactinoma ([place]); Goiter; H/O: pituitary tumor; Depression; Hypertriglyceridemia; AMA (advanced maternal age) multigravida 90+; Supervision of elderly multigravida (>=25 years old at time of delivery); and Aborter, habitual, antepartum on her problem list.  Patient reports no complaints.  Contractions: Not present. Vag. Bleeding: None.  Movement: Present. Denies leaking of fluid.   The following portions of the patient's history were reviewed and updated as appropriate: allergies, current medications, past family history, past medical history, past social history, past surgical history and problem list. Problem list updated.  Objective:   Vitals:   09/19/16 1552  BP: (!) 102/58  Pulse: (!) 54  Weight: 185 lb (83.9 kg)    Fetal Status: Fetal Heart Rate (bpm): 138 Fundal Height: 21 cm Movement: Present     General:  Alert, oriented and cooperative. Patient is in no acute distress.  Skin: Skin is warm and dry. No rash noted.   Cardiovascular: Normal heart rate noted  Respiratory: Normal respiratory effort, no problems with respiration noted  Abdomen: Soft, gravid, appropriate for gestational age. Pain/Pressure: Absent     Pelvic:  Cervical exam deferred        Extremities: Normal range of motion.     Mental Status: Normal mood and affect. Normal behavior. Normal judgment and thought content.   Assessment and Plan:  Pregnancy: [id number] at [redacted]w[redacted]d  1. Supervision of elderly multigravida, antepartum Normal genetic testing and [place]  2. H/O: pituitary tumor bromocriptine  Preterm labor symptoms and general obstetric precautions including but not limited to vaginal bleeding, contractions, leaking of fluid and fetal movement were reviewed in detail with the patient. Please  refer to After Visit Summary for other counseling recommendations.  Return in about 4 weeks (around 10/17/2016). F/u US in 1 week  Woodroe Mode, MD

## 2016-09-19 NOTE — Patient Instructions (Signed)

## 2016-09-19 NOTE — Progress Notes (Signed)
Pt states that she has not taken Bromocriptine in apprx 2 weeks. Pt would like to discuss. Pt needs to discuss breastfeeding, if she can with Pituitary problems.

## 2016-09-26 ENCOUNTER — Ambulatory Visit (HOSPITAL_COMMUNITY)
Admission: RE | Admit: 2016-09-26 | Discharge: 2016-09-26 | Disposition: A | Discharge status: Home / Self Care | Payer: Medicaid Other | Source: Ambulatory Visit | Attending: Obstetrics and Gynecology | Admitting: Obstetrics and Gynecology

## 2016-09-26 ENCOUNTER — Encounter (HOSPITAL_COMMUNITY): Payer: Self-pay

## 2016-09-26 DIAGNOSIS — O358XX0 Maternal care for other (suspected) fetal abnormality and damage, not applicable or unspecified: Secondary | ICD-10-CM | POA: Insufficient documentation

## 2016-09-26 DIAGNOSIS — Z3A22 22 weeks gestation of pregnancy: Secondary | ICD-10-CM | POA: Diagnosis not present

## 2016-09-26 DIAGNOSIS — O09522 Supervision of elderly multigravida, second trimester: Secondary | ICD-10-CM

## 2016-09-27 ENCOUNTER — Other Ambulatory Visit (HOSPITAL_COMMUNITY): Payer: Self-pay | Admitting: *Deleted

## 2016-09-27 DIAGNOSIS — O09522 Supervision of elderly multigravida, second trimester: Secondary | ICD-10-CM

## 2016-10-16 ENCOUNTER — Other Ambulatory Visit: Payer: Self-pay | Admitting: Obstetrics

## 2016-10-17 ENCOUNTER — Encounter: Payer: Medicaid Other | Admitting: Obstetrics & Gynecology

## 2016-11-07 ENCOUNTER — Ambulatory Visit (HOSPITAL_COMMUNITY)
Admission: RE | Admit: 2016-11-07 | Discharge: 2016-11-07 | Disposition: A | Discharge status: Home / Self Care | Payer: Medicaid Other | Source: Ambulatory Visit | Attending: Obstetrics and Gynecology | Admitting: Obstetrics and Gynecology

## 2016-11-07 ENCOUNTER — Encounter (HOSPITAL_COMMUNITY): Payer: Self-pay

## 2016-11-07 DIAGNOSIS — Z362 Encounter for other antenatal screening follow-up: Secondary | ICD-10-CM | POA: Diagnosis not present

## 2016-11-07 DIAGNOSIS — O358XX0 Maternal care for other (suspected) fetal abnormality and damage, not applicable or unspecified: Secondary | ICD-10-CM | POA: Diagnosis not present

## 2016-11-07 DIAGNOSIS — O09523 Supervision of elderly multigravida, third trimester: Secondary | ICD-10-CM | POA: Insufficient documentation

## 2016-11-07 DIAGNOSIS — Z3A28 28 weeks gestation of pregnancy: Secondary | ICD-10-CM | POA: Diagnosis not present

## 2016-11-07 DIAGNOSIS — O09522 Supervision of elderly multigravida, second trimester: Secondary | ICD-10-CM

## 2016-11-07 NOTE — Addendum Note (Signed)
Encounter addended by: Marko Stai, RDMS on: 11/07/2016  4:46 PM<BR>    Actions taken: Imaging Exam ended

## 2016-11-08 ENCOUNTER — Other Ambulatory Visit (HOSPITAL_COMMUNITY): Payer: Self-pay | Admitting: *Deleted

## 2016-11-08 DIAGNOSIS — O09523 Supervision of elderly multigravida, third trimester: Secondary | ICD-10-CM

## 2016-11-09 ENCOUNTER — Other Ambulatory Visit (HOSPITAL_COMMUNITY): Payer: Self-pay | Admitting: *Deleted

## 2016-11-09 DIAGNOSIS — O09529 Supervision of elderly multigravida, unspecified trimester: Secondary | ICD-10-CM

## 2016-11-22 DIAGNOSIS — Z3493 Encounter for supervision of normal pregnancy, unspecified, third trimester: Secondary | ICD-10-CM

## 2016-11-30 ENCOUNTER — Inpatient Hospital Stay (HOSPITAL_COMMUNITY)
Admission: AD | Admit: 2016-11-30 | Discharge: 2016-11-30 | Disposition: A | Discharge status: Home / Self Care | Payer: Medicaid Other | Source: Ambulatory Visit | Attending: Obstetrics & Gynecology | Admitting: Obstetrics & Gynecology

## 2016-11-30 ENCOUNTER — Encounter (HOSPITAL_COMMUNITY): Payer: Self-pay | Admitting: *Deleted

## 2016-11-30 DIAGNOSIS — Z79899 Other long term (current) drug therapy: Secondary | ICD-10-CM | POA: Insufficient documentation

## 2016-11-30 DIAGNOSIS — O99353 Diseases of the nervous system complicating pregnancy, third trimester: Secondary | ICD-10-CM | POA: Insufficient documentation

## 2016-11-30 DIAGNOSIS — D352 Benign neoplasm of pituitary gland: Secondary | ICD-10-CM | POA: Insufficient documentation

## 2016-11-30 DIAGNOSIS — O9989 Other specified diseases and conditions complicating pregnancy, childbirth and the puerperium: Secondary | ICD-10-CM | POA: Diagnosis not present

## 2016-11-30 DIAGNOSIS — Z832 Family history of diseases of the blood and blood-forming organs and certain disorders involving the immune mechanism: Secondary | ICD-10-CM | POA: Insufficient documentation

## 2016-11-30 DIAGNOSIS — G44209 Tension-type headache, unspecified, not intractable: Secondary | ICD-10-CM

## 2016-11-30 DIAGNOSIS — O99343 Other mental disorders complicating pregnancy, third trimester: Secondary | ICD-10-CM | POA: Insufficient documentation

## 2016-11-30 DIAGNOSIS — F329 Major depressive disorder, single episode, unspecified: Secondary | ICD-10-CM | POA: Diagnosis not present

## 2016-11-30 DIAGNOSIS — Z8349 Family history of other endocrine, nutritional and metabolic diseases: Secondary | ICD-10-CM | POA: Diagnosis not present

## 2016-11-30 DIAGNOSIS — Z833 Family history of diabetes mellitus: Secondary | ICD-10-CM | POA: Insufficient documentation

## 2016-11-30 DIAGNOSIS — Z3A31 31 weeks gestation of pregnancy: Secondary | ICD-10-CM | POA: Diagnosis not present

## 2016-11-30 DIAGNOSIS — Z3689 Encounter for other specified antenatal screening: Secondary | ICD-10-CM

## 2016-11-30 LAB — URINALYSIS, ROUTINE W REFLEX MICROSCOPIC
Bilirubin Urine: NEGATIVE
Glucose, UA: NEGATIVE mg/dL
HGB URINE DIPSTICK: NEGATIVE
Ketones, ur: NEGATIVE mg/dL
Leukocytes, UA: NEGATIVE
NITRITE: NEGATIVE
PH: 7 (ref 5.0–8.0)
Protein, ur: NEGATIVE mg/dL
SPECIFIC GRAVITY, URINE: 1.012 (ref 1.005–1.030)

## 2016-11-30 MED ORDER — BUTALBITAL-APAP-CAFFEINE 50-325-40 MG PO TABS
2.0000 | ORAL_TABLET | Freq: Once | ORAL | Status: AC
  Administered 2016-11-30: 2 via ORAL
  Filled 2016-11-30: qty 2

## 2016-11-30 MED ORDER — BUTALBITAL-APAP-CAFFEINE 50-325-40 MG PO TABS: 1.0000 | ORAL_TABLET | Freq: Four times a day (QID) | ORAL | 0 refills | Status: AC | PRN

## 2016-11-30 NOTE — MAU Note (Signed)
Have had h/a for 3 days. Have taken ibuprofen and tylenol and not helping. Has hx migraines

## 2016-11-30 NOTE — Discharge Instructions (Signed)

## 2016-11-30 NOTE — MAU Provider Note (Signed)
History     CSN: [id number]  Arrival date and time: 11/30/16 0865  First Provider Initiated Contact with Patient 11/30/16 2101      Chief Complaint  Patient presents with  . Headache   Shelley Hardy is a 41 y.o. [id number] at [redacted]w[redacted]d who presents with headache x 3 days. Feels like previous headaches. Denies n/v, abdominal pain, vaginal bleeding, or LOF. Positive fetal movement.    Headache   This is a new problem. Episode onset: x 3 days. The problem occurs constantly. The problem has been unchanged. The pain is located in the right unilateral region. The pain does not radiate. The pain quality is similar to prior headaches. The quality of the pain is described as throbbing. The pain is at a severity of 9/10. Associated symptoms include phonophobia and photophobia. Pertinent negatives include no abdominal pain, blurred vision, dizziness, drainage, eye watering, fever, nausea, sinus pressure, visual change or vomiting. The symptoms are aggravated by noise and bright light. She has tried acetaminophen and NSAIDs for the symptoms. The treatment provided no relief. Her past medical history is significant for migraine headaches. There is no history of hypertension or recent head traumas. (+hx pituitary adenoma)   OB History    Gravida Para Term Preterm AB Living   8 3 3  0 4 3   SAB TAB Ectopic Multiple Live Births   4 0 0 0 3      Past Medical History:  Diagnosis Date  . AMA (advanced maternal age) multigravida 69+   . Depression    post partum  . Pituitary tumor     Past Surgical History:  Procedure Laterality Date  . NO PAST SURGERIES    . none      Family History  Problem Relation Age of Onset  . Anemia Cousin        patient's first cousin once removed; parents of cousin are first cousins to each other and to the patient  . Diabetes Maternal Grandmother   . Thyroid disease Maternal Aunt   . Glucose-6-phos deficiency Son     Social History  Substance Use Topics  .  Smoking status: Never Smoker  . Smokeless tobacco: Never Used  . Alcohol use No    Allergies: No Known Allergies  Prescriptions Prior to Admission  Medication Sig Dispense Refill Last Dose  . bromocriptine (PARLODEL) 2.5 MG tablet Take 1 tablet (2.5 mg total) by mouth daily. (Patient not taking: Reported on 09/19/2016) 30 tablet 2 Not Taking  . docusate sodium (COLACE) 100 MG capsule Take 1 capsule (100 mg total) by mouth 2 (two) times daily. (Patient not taking: Reported on 09/26/2016) 60 capsule 0 Not Taking  . famotidine (PEPCID) 20 MG tablet Take 20 mg by mouth 2 (two) times daily as needed for heartburn or indigestion.   Taking  . OVER THE COUNTER MEDICATION Take 1 tablet by mouth daily as needed (For constipation.). Patient is taking Prunelax.   Not Taking  . polyethylene glycol (MIRALAX / GLYCOLAX) packet Take 17 g by mouth daily as needed for moderate constipation.   Not Taking  . Prenatal Vit-DSS-Fe Fum-FA (SE-NATAL 19) 29-1 MG TABS TAKE 1 TABLET BY MOUTH DAILY 30 tablet 0 Taking  . Prenatal Vit-Fe Fumarate-FA (PRENATAL MULTIVITAMIN) TABS tablet Take 1 tablet by mouth daily at 12 noon.   Taking  . promethazine (PHENERGAN) 25 MG tablet Take 1 tablet (25 mg total) by mouth every 6 (six) hours as needed for nausea or vomiting. 20 tablet  2 Taking    Review of Systems  Constitutional: Negative for fever.  HENT: Negative for sinus pressure.   Eyes: Positive for photophobia. Negative for blurred vision, discharge and visual disturbance.  Respiratory: Negative for shortness of breath.   Cardiovascular: Negative for chest pain.  Gastrointestinal: Negative.  Negative for abdominal pain, nausea and vomiting.  Genitourinary: Negative.   Neurological: Positive for headaches. Negative for dizziness, facial asymmetry and light-headedness.   Physical Exam   Blood pressure 116/65, pulse 99, temperature 97.8 F (36.6 C), resp. rate 18, height 5\' 6"  (1.676 m), weight 184 lb (83.5 kg), last  menstrual period 04/24/2016, SpO2 98 %, unknown if currently breastfeeding.  Physical Exam  Nursing note and vitals reviewed. Constitutional: She is oriented to person, place, and time. She appears well-developed and well-nourished. No distress.  HENT:  Head: Normocephalic and atraumatic.  Eyes: Conjunctivae are normal. Right eye exhibits no discharge. Left eye exhibits no discharge. No scleral icterus.  Neck: Normal range of motion.  Cardiovascular: Normal rate, regular rhythm and normal heart sounds.   No murmur heard. Respiratory: Effort normal and breath sounds normal. No respiratory distress. She has no wheezes.  GI: Soft. Bowel sounds are normal. There is no tenderness.  Neurological: She is alert and oriented to person, place, and time.  Skin: Skin is warm and dry. She is not diaphoretic.  Psychiatric: She has a normal mood and affect. Her behavior is normal. Judgment and thought content normal.   Fetal Tracing: Baseline: 130 Variability: moderate Accelerations: 15x15 Decelerations: none Toco: none  MAU Course  Procedures Results for orders placed or performed during the hospital encounter of 11/30/16 (from the past 48 hour(s))  Urinalysis, Routine w reflex microscopic     Status: None   Collection Time: 11/30/16  8:15 PM  Result Value Ref Range   Color, Urine YELLOW YELLOW   APPearance CLEAR CLEAR   Specific Gravity, Urine 1.012 1.005 - 1.030   pH 7.0 5.0 - 8.0   Glucose, UA NEGATIVE NEGATIVE mg/dL   Hgb urine dipstick NEGATIVE NEGATIVE   Bilirubin Urine NEGATIVE NEGATIVE   Ketones, ur NEGATIVE NEGATIVE mg/dL   Protein, ur NEGATIVE NEGATIVE mg/dL   Nitrite NEGATIVE NEGATIVE   Leukocytes, UA NEGATIVE NEGATIVE    MDM Reactive NST VSS Fioricet 2 tabs PO Care turned over to [facility], NP 11/30/2016 10:09 PM  Pt reports that her aunt died recently and the HA started after a prolonged episode of crying. HA is improved after Fioricet. She  is stable for discharge home.  Assessment and Plan   1. [redacted] weeks gestation of pregnancy   2. Acute non intractable tension-type headache   3. NST (non-stress test) reactive    Discharge home Follow up in OB office as scheduled Return to MAU for worsening sx Rx Fioricet  Allergies as of 11/30/2016   No Known Allergies     Medication List    STOP taking these medications   bromocriptine 2.5 MG tablet Commonly known as:  PARLODEL   docusate sodium 100 MG capsule Commonly known as:  COLACE     TAKE these medications   butalbital-acetaminophen-caffeine 50-325-40 MG tablet Commonly known as:  FIORICET, ESGIC Take 1-2 tablets by mouth every 6 (six) hours as needed for headache.   famotidine 20 MG tablet Commonly known as:  PEPCID Take 20 mg by mouth 2 (two) times daily as needed for heartburn or indigestion.   OVER THE COUNTER MEDICATION  Take 1 tablet by mouth daily as needed (For constipation.). Patient is taking Prunelax.   polyethylene glycol packet Commonly known as:  MIRALAX / GLYCOLAX Take 17 g by mouth daily as needed for moderate constipation.   prenatal multivitamin Tabs tablet Take 1 tablet by mouth daily at 12 noon.   promethazine 25 MG tablet Commonly known as:  PHENERGAN Take 1 tablet (25 mg total) by mouth every 6 (six) hours as needed for nausea or vomiting.   SE-NATAL 19 29-1 MG Tabs TAKE 1 TABLET BY MOUTH DAILY      Julianne Handler, CNM  11/30/2016 11:24 PM

## 2016-12-03 ENCOUNTER — Ambulatory Visit (INDEPENDENT_AMBULATORY_CARE_PROVIDER_SITE_OTHER): Payer: Medicaid Other | Admitting: Obstetrics and Gynecology

## 2016-12-03 VITALS — BP 109/66 | HR 89 | Wt 184.9 lb

## 2016-12-03 DIAGNOSIS — D352 Benign neoplasm of pituitary gland: Secondary | ICD-10-CM

## 2016-12-03 DIAGNOSIS — O09523 Supervision of elderly multigravida, third trimester: Secondary | ICD-10-CM

## 2016-12-03 NOTE — Progress Notes (Signed)
Subjective:  Shelley Hardy is a 41 y.o. [id number] at [redacted]w[redacted]d being seen today for ongoing prenatal care.  She is currently monitored for the following issues for this high-risk pregnancy and has Prolactinoma ([place]); Goiter; H/O: pituitary tumor; Depression; Hypertriglyceridemia; AMA (advanced maternal age) multigravida 64+; Supervision of elderly multigravida (>=23 years old at time of delivery); and Aborter, habitual, antepartum on her problem list.  Patient reports no complaints.  Contractions: Not present. Vag. Bleeding: None.  Movement: Present. Denies leaking of fluid.   The following portions of the patient's history were reviewed and updated as appropriate: allergies, current medications, past family history, past medical history, past social history, past surgical history and problem list. Problem list updated.  Objective:   Vitals:   12/03/16 1629  BP: 109/66  Pulse: 89  Weight: 184 lb 14.4 oz (83.9 kg)    Fetal Status: Fetal Heart Rate (bpm): 158   Movement: Present     General:  Alert, oriented and cooperative. Patient is in no acute distress.  Skin: Skin is warm and dry. No rash noted.   Cardiovascular: Normal heart rate noted  Respiratory: Normal respiratory effort, no problems with respiration noted  Abdomen: Soft, gravid, appropriate for gestational age. Pain/Pressure: Present     Pelvic:  Cervical exam deferred        Extremities: Normal range of motion.  Edema: None  Mental Status: Normal mood and affect. Normal behavior. Normal judgment and thought content.   Urinalysis:      Assessment and Plan:  Pregnancy: [id number] at [redacted]w[redacted]d  1. Encounter for supervision of multigravida of advanced maternal age in third trimester Last OB visit was in March. Pt states missing appts d/t work. Stressed to the pt importance of keeping appts Will schedule appt for glucola this week  2. Elderly multigravida in third trimester Nl NIPS  3. Prolactinoma ([place]) Taking  Bromocriptine  Preterm labor symptoms and general obstetric precautions including but not limited to vaginal bleeding, contractions, leaking of fluid and fetal movement were reviewed in detail with the patient. Please refer to After Visit Summary for other counseling recommendations.  Return in about 2 weeks (around 12/17/2016).   Chancy Milroy, MD

## 2016-12-03 NOTE — Progress Notes (Signed)
Patient reports good fetal movement and occasional pressure, denies contractions.

## 2016-12-03 NOTE — Patient Instructions (Signed)
Third Trimester of Pregnancy The third trimester is from week 28 through week 40 (months 7 through 9). The third trimester is a time when the unborn baby (fetus) is growing rapidly. At the end of the ninth month, the fetus is about 20 inches in length and weighs 6-10 pounds. Body changes during your third trimester Your body will continue to go through many changes during pregnancy. The changes vary from woman to woman. During the third trimester:  Your weight will continue to increase. You can expect to gain 25-35 pounds (11-16 kg) by the end of the pregnancy.  You may begin to get stretch marks on your hips, abdomen, and breasts.  You may urinate more often because the fetus is moving lower into your pelvis and pressing on your bladder.  You may develop or continue to have heartburn. This is caused by increased hormones that slow down muscles in the digestive tract.  You may develop or continue to have constipation because increased hormones slow digestion and cause the muscles that push waste through your intestines to relax.  You may develop hemorrhoids. These are swollen veins (varicose veins) in the rectum that can itch or be painful.  You may develop swollen, bulging veins (varicose veins) in your legs.  You may have increased body aches in the pelvis, back, or thighs. This is due to weight gain and increased hormones that are relaxing your joints.  You may have changes in your hair. These can include thickening of your hair, rapid growth, and changes in texture. Some women also have hair loss during or after pregnancy, or hair that feels dry or thin. Your hair will most likely return to normal after your baby is born.  Your breasts will continue to grow and they will continue to become tender. A yellow fluid (colostrum) may leak from your breasts. This is the first milk you are producing for your baby.  Your belly button may stick out.  You may notice more swelling in your hands,  face, or ankles.  You may have increased tingling or numbness in your hands, arms, and legs. The skin on your belly may also feel numb.  You may feel short of breath because of your expanding uterus.  You may have more problems sleeping. This can be caused by the size of your belly, increased need to urinate, and an increase in your body's metabolism.  You may notice the fetus "dropping," or moving lower in your abdomen (lightening).  You may have increased vaginal discharge.  You may notice your joints feel loose and you may have pain around your pelvic bone.  What to expect at prenatal visits You will have prenatal exams every 2 weeks until week 36. Then you will have weekly prenatal exams. During a routine prenatal visit:  You will be weighed to make sure you and the baby are growing normally.  Your blood pressure will be taken.  Your abdomen will be measured to track your baby's growth.  The fetal heartbeat will be listened to.  Any test results from the previous visit will be discussed.  You may have a cervical check near your due date to see if your cervix has softened or thinned (effaced).  You will be tested for Group B streptococcus. This happens between 35 and 37 weeks.  Your health care provider may ask you:  What your birth plan is.  How you are feeling.  If you are feeling the baby move.  If you have had   any abnormal symptoms, such as leaking fluid, bleeding, severe headaches, or abdominal cramping.  If you are using any tobacco products, including cigarettes, chewing tobacco, and electronic cigarettes.  If you have any questions.  Other tests or screenings that may be performed during your third trimester include:  Blood tests that check for low iron levels (anemia).  Fetal testing to check the health, activity level, and growth of the fetus. Testing is done if you have certain medical conditions or if there are problems during the  pregnancy.  Nonstress test (NST). This test checks the health of your baby to make sure there are no signs of problems, such as the baby not getting enough oxygen. During this test, a belt is placed around your belly. The baby is made to move, and its heart rate is monitored during movement.  What is false labor? False labor is a condition in which you feel small, irregular tightenings of the muscles in the womb (contractions) that usually go away with rest, changing position, or drinking water. These are called Braxton Hicks contractions. Contractions may last for hours, days, or even weeks before true labor sets in. If contractions come at regular intervals, become more frequent, increase in intensity, or become painful, you should see your health care provider. What are the signs of labor?  Abdominal cramps.  Regular contractions that start at 10 minutes apart and become stronger and more frequent with time.  Contractions that start on the top of the uterus and spread down to the lower abdomen and back.  Increased pelvic pressure and dull back pain.  A watery or bloody mucus discharge that comes from the vagina.  Leaking of amniotic fluid. This is also known as your "water breaking." It could be a slow trickle or a gush. Let your health care provider know if it has a color or strange odor. If you have any of these signs, call your health care provider right away, even if it is before your due date. Follow these instructions at home: Medicines  Follow your health care provider's instructions regarding medicine use. Specific medicines may be either safe or unsafe to take during pregnancy.  Take a prenatal vitamin that contains at least 600 micrograms (mcg) of folic acid.  If you develop constipation, try taking a stool softener if your health care provider approves. Eating and drinking  Eat a balanced diet that includes fresh fruits and vegetables, whole grains, good sources of protein  such as meat, eggs, or tofu, and low-fat dairy. Your health care provider will help you determine the amount of weight gain that is right for you.  Avoid raw meat and uncooked cheese. These carry germs that can cause birth defects in the baby.  If you have low calcium intake from food, talk to your health care provider about whether you should take a daily calcium supplement.  Eat four or five small meals rather than three large meals a day.  Limit foods that are high in fat and processed sugars, such as fried and sweet foods.  To prevent constipation: ? Drink enough fluid to keep your urine clear or pale yellow. ? Eat foods that are high in fiber, such as fresh fruits and vegetables, whole grains, and beans. Activity  Exercise only as directed by your health care provider. Most women can continue their usual exercise routine during pregnancy. Try to exercise for 30 minutes at least 5 days a week. Stop exercising if you experience uterine contractions.  Avoid heavy   lifting.  Do not exercise in extreme heat or humidity, or at high altitudes.  Wear low-heel, comfortable shoes.  Practice good posture.  You may continue to have sex unless your health care provider tells you otherwise. Relieving pain and discomfort  Take frequent breaks and rest with your legs elevated if you have leg cramps or low back pain.  Take warm sitz baths to soothe any pain or discomfort caused by hemorrhoids. Use hemorrhoid cream if your health care provider approves.  Wear a good support bra to prevent discomfort from breast tenderness.  If you develop varicose veins: ? Wear support pantyhose or compression stockings as told by your healthcare provider. ? Elevate your feet for 15 minutes, 3-4 times a day. Prenatal care  Write down your questions. Take them to your prenatal visits.  Keep all your prenatal visits as told by your health care provider. This is important. Safety  Wear your seat belt at  all times when driving.  Make a list of emergency phone numbers, including numbers for family, friends, the hospital, and police and fire departments. General instructions  Avoid cat litter boxes and soil used by cats. These carry germs that can cause birth defects in the baby. If you have a cat, ask someone to clean the litter box for you.  Do not travel far distances unless it is absolutely necessary and only with the approval of your health care provider.  Do not use hot tubs, steam rooms, or saunas.  Do not drink alcohol.  Do not use any products that contain nicotine or tobacco, such as cigarettes and e-cigarettes. If you need help quitting, ask your health care provider.  Do not use any medicinal herbs or unprescribed drugs. These chemicals affect the formation and growth of the baby.  Do not douche or use tampons or scented sanitary pads.  Do not cross your legs for long periods of time.  To prepare for the arrival of your baby: ? Take prenatal classes to understand, practice, and ask questions about labor and delivery. ? Make a trial run to the hospital. ? Visit the hospital and tour the maternity area. ? Arrange for maternity or paternity leave through employers. ? Arrange for family and friends to take care of pets while you are in the hospital. ? Purchase a rear-facing car seat and make sure you know how to install it in your car. ? Pack your hospital bag. ? Prepare the baby's nursery. Make sure to remove all pillows and stuffed animals from the baby's crib to prevent suffocation.  Visit your dentist if you have not gone during your pregnancy. Use a soft toothbrush to brush your teeth and be gentle when you floss. Contact a health care provider if:  You are unsure if you are in labor or if your water has broken.  You become dizzy.  You have mild pelvic cramps, pelvic pressure, or nagging pain in your abdominal area.  You have lower back pain.  You have persistent  nausea, vomiting, or diarrhea.  You have an unusual or bad smelling vaginal discharge.  You have pain when you urinate. Get help right away if:  Your water breaks before 37 weeks.  You have regular contractions less than 5 minutes apart before 37 weeks.  You have a fever.  You are leaking fluid from your vagina.  You have spotting or bleeding from your vagina.  You have severe abdominal pain or cramping.  You have rapid weight loss or weight gain.    You have shortness of breath with chest pain.  You notice sudden or extreme swelling of your face, hands, ankles, feet, or legs.  Your baby makes fewer than 10 movements in 2 hours.  You have severe headaches that do not go away when you take medicine.  You have vision changes. Summary  The third trimester is from week 28 through week 40, months 7 through 9. The third trimester is a time when the unborn baby (fetus) is growing rapidly.  During the third trimester, your discomfort may increase as you and your baby continue to gain weight. You may have abdominal, leg, and back pain, sleeping problems, and an increased need to urinate.  During the third trimester your breasts will keep growing and they will continue to become tender. A yellow fluid (colostrum) may leak from your breasts. This is the first milk you are producing for your baby.  False labor is a condition in which you feel small, irregular tightenings of the muscles in the womb (contractions) that eventually go away. These are called Braxton Hicks contractions. Contractions may last for hours, days, or even weeks before true labor sets in.  Signs of labor can include: abdominal cramps; regular contractions that start at 10 minutes apart and become stronger and more frequent with time; watery or bloody mucus discharge that comes from the vagina; increased pelvic pressure and dull back pain; and leaking of amniotic fluid. This information is not intended to replace advice  given to you by your health care provider. Make sure you discuss any questions you have with your health care provider. Document Released: 06/26/2001 Document Revised: 12/08/2015 Document Reviewed: 09/02/2012 Elsevier Interactive Patient Education  2017 [employer]  

## 2016-12-05 ENCOUNTER — Ambulatory Visit (HOSPITAL_COMMUNITY)
Admission: RE | Admit: 2016-12-05 | Discharge: 2016-12-05 | Disposition: A | Discharge status: Home / Self Care | Payer: Medicaid Other | Source: Ambulatory Visit | Attending: Obstetrics and Gynecology | Admitting: Obstetrics and Gynecology

## 2016-12-05 ENCOUNTER — Encounter (HOSPITAL_COMMUNITY): Payer: Self-pay

## 2016-12-05 DIAGNOSIS — O358XX0 Maternal care for other (suspected) fetal abnormality and damage, not applicable or unspecified: Secondary | ICD-10-CM | POA: Insufficient documentation

## 2016-12-05 DIAGNOSIS — O09523 Supervision of elderly multigravida, third trimester: Secondary | ICD-10-CM | POA: Insufficient documentation

## 2016-12-05 DIAGNOSIS — Z3A32 32 weeks gestation of pregnancy: Secondary | ICD-10-CM | POA: Insufficient documentation

## 2016-12-07 ENCOUNTER — Other Ambulatory Visit: Payer: Medicaid Other

## 2016-12-07 DIAGNOSIS — O09523 Supervision of elderly multigravida, third trimester: Secondary | ICD-10-CM

## 2016-12-08 LAB — GLUCOSE TOLERANCE, 2 HOURS W/ 1HR
GLUCOSE, 1 HOUR: 130 mg/dL (ref 65–179)
GLUCOSE, 2 HOUR: 95 mg/dL (ref 65–152)
GLUCOSE, FASTING: 74 mg/dL (ref 65–91)

## 2016-12-08 LAB — CBC
Hematocrit: 31.2 % — ABNORMAL LOW (ref 34.0–46.6)
Hemoglobin: 10.5 g/dL — ABNORMAL LOW (ref 11.1–15.9)
MCH: 30 pg (ref 26.6–33.0)
MCHC: 33.7 g/dL (ref 31.5–35.7)
MCV: 89 fL (ref 79–97)
Platelets: 143 10*3/uL — ABNORMAL LOW (ref 150–379)
RBC: 3.5 x10E6/uL — ABNORMAL LOW (ref 3.77–5.28)
RDW: 13.6 % (ref 12.3–15.4)
WBC: 5.3 10*3/uL (ref 3.4–10.8)

## 2016-12-08 LAB — RPR: RPR: NONREACTIVE

## 2016-12-08 LAB — HIV ANTIBODY (ROUTINE TESTING W REFLEX): HIV SCREEN 4TH GENERATION: NONREACTIVE

## 2016-12-12 ENCOUNTER — Other Ambulatory Visit (HOSPITAL_COMMUNITY): Payer: Self-pay | Admitting: Obstetrics and Gynecology

## 2016-12-12 ENCOUNTER — Encounter (HOSPITAL_COMMUNITY): Payer: Self-pay

## 2016-12-12 ENCOUNTER — Ambulatory Visit (HOSPITAL_COMMUNITY)
Admission: RE | Admit: 2016-12-12 | Discharge: 2016-12-12 | Disposition: A | Discharge status: Home / Self Care | Payer: Medicaid Other | Source: Ambulatory Visit | Attending: Obstetrics and Gynecology | Admitting: Obstetrics and Gynecology

## 2016-12-12 DIAGNOSIS — Z3A33 33 weeks gestation of pregnancy: Secondary | ICD-10-CM | POA: Diagnosis not present

## 2016-12-12 DIAGNOSIS — O358XX0 Maternal care for other (suspected) fetal abnormality and damage, not applicable or unspecified: Secondary | ICD-10-CM | POA: Diagnosis not present

## 2016-12-12 DIAGNOSIS — O09523 Supervision of elderly multigravida, third trimester: Secondary | ICD-10-CM

## 2016-12-12 DIAGNOSIS — O359XX0 Maternal care for (suspected) fetal abnormality and damage, unspecified, not applicable or unspecified: Secondary | ICD-10-CM

## 2016-12-19 ENCOUNTER — Ambulatory Visit (HOSPITAL_COMMUNITY)
Admission: RE | Admit: 2016-12-19 | Discharge: 2016-12-19 | Disposition: A | Discharge status: Home / Self Care | Payer: Medicaid Other | Source: Ambulatory Visit | Attending: Obstetrics and Gynecology | Admitting: Obstetrics and Gynecology

## 2016-12-19 ENCOUNTER — Encounter (HOSPITAL_COMMUNITY): Payer: Self-pay

## 2016-12-19 DIAGNOSIS — O09523 Supervision of elderly multigravida, third trimester: Secondary | ICD-10-CM | POA: Diagnosis not present

## 2016-12-19 DIAGNOSIS — Z3A34 34 weeks gestation of pregnancy: Secondary | ICD-10-CM | POA: Insufficient documentation

## 2016-12-19 NOTE — Addendum Note (Signed)
Encounter addended by: Jill Poling, RT on: 12/19/2016  4:39 PM<BR>    Actions taken: Imaging Exam ended

## 2016-12-20 ENCOUNTER — Ambulatory Visit (INDEPENDENT_AMBULATORY_CARE_PROVIDER_SITE_OTHER): Payer: Medicaid Other | Admitting: Obstetrics & Gynecology

## 2016-12-20 ENCOUNTER — Encounter: Payer: Self-pay | Admitting: *Deleted

## 2016-12-20 VITALS — BP 104/70 | HR 92 | Wt 188.0 lb

## 2016-12-20 DIAGNOSIS — O09523 Supervision of elderly multigravida, third trimester: Secondary | ICD-10-CM

## 2016-12-20 MED ORDER — HYDROCORTISONE ACETATE 25 MG RE SUPP: 25.0000 mg | Freq: Two times a day (BID) | RECTAL | 1 refills | Status: AC

## 2016-12-20 NOTE — Patient Instructions (Signed)
Third Trimester of Pregnancy The third trimester is from week 28 through week 40 (months 7 through 9). The third trimester is a time when the unborn baby (fetus) is growing rapidly. At the end of the ninth month, the fetus is about 20 inches in length and weighs 6-10 pounds. Body changes during your third trimester Your body will continue to go through many changes during pregnancy. The changes vary from woman to woman. During the third trimester:  Your weight will continue to increase. You can expect to gain 25-35 pounds (11-16 kg) by the end of the pregnancy.  You may begin to get stretch marks on your hips, abdomen, and breasts.  You may urinate more often because the fetus is moving lower into your pelvis and pressing on your bladder.  You may develop or continue to have heartburn. This is caused by increased hormones that slow down muscles in the digestive tract.  You may develop or continue to have constipation because increased hormones slow digestion and cause the muscles that push waste through your intestines to relax.  You may develop hemorrhoids. These are swollen veins (varicose veins) in the rectum that can itch or be painful.  You may develop swollen, bulging veins (varicose veins) in your legs.  You may have increased body aches in the pelvis, back, or thighs. This is due to weight gain and increased hormones that are relaxing your joints.  You may have changes in your hair. These can include thickening of your hair, rapid growth, and changes in texture. Some women also have hair loss during or after pregnancy, or hair that feels dry or thin. Your hair will most likely return to normal after your baby is born.  Your breasts will continue to grow and they will continue to become tender. A yellow fluid (colostrum) may leak from your breasts. This is the first milk you are producing for your baby.  Your belly button may stick out.  You may notice more swelling in your hands,  face, or ankles.  You may have increased tingling or numbness in your hands, arms, and legs. The skin on your belly may also feel numb.  You may feel short of breath because of your expanding uterus.  You may have more problems sleeping. This can be caused by the size of your belly, increased need to urinate, and an increase in your body's metabolism.  You may notice the fetus "dropping," or moving lower in your abdomen (lightening).  You may have increased vaginal discharge.  You may notice your joints feel loose and you may have pain around your pelvic bone.  What to expect at prenatal visits You will have prenatal exams every 2 weeks until week 36. Then you will have weekly prenatal exams. During a routine prenatal visit:  You will be weighed to make sure you and the baby are growing normally.  Your blood pressure will be taken.  Your abdomen will be measured to track your baby's growth.  The fetal heartbeat will be listened to.  Any test results from the previous visit will be discussed.  You may have a cervical check near your due date to see if your cervix has softened or thinned (effaced).  You will be tested for Group B streptococcus. This happens between 35 and 37 weeks.  Your health care provider may ask you:  What your birth plan is.  How you are feeling.  If you are feeling the baby move.  If you have had   any abnormal symptoms, such as leaking fluid, bleeding, severe headaches, or abdominal cramping.  If you are using any tobacco products, including cigarettes, chewing tobacco, and electronic cigarettes.  If you have any questions.  Other tests or screenings that may be performed during your third trimester include:  Blood tests that check for low iron levels (anemia).  Fetal testing to check the health, activity level, and growth of the fetus. Testing is done if you have certain medical conditions or if there are problems during the  pregnancy.  Nonstress test (NST). This test checks the health of your baby to make sure there are no signs of problems, such as the baby not getting enough oxygen. During this test, a belt is placed around your belly. The baby is made to move, and its heart rate is monitored during movement.  What is false labor? False labor is a condition in which you feel small, irregular tightenings of the muscles in the womb (contractions) that usually go away with rest, changing position, or drinking water. These are called Braxton Hicks contractions. Contractions may last for hours, days, or even weeks before true labor sets in. If contractions come at regular intervals, become more frequent, increase in intensity, or become painful, you should see your health care provider. What are the signs of labor?  Abdominal cramps.  Regular contractions that start at 10 minutes apart and become stronger and more frequent with time.  Contractions that start on the top of the uterus and spread down to the lower abdomen and back.  Increased pelvic pressure and dull back pain.  A watery or bloody mucus discharge that comes from the vagina.  Leaking of amniotic fluid. This is also known as your "water breaking." It could be a slow trickle or a gush. Let your health care provider know if it has a color or strange odor. If you have any of these signs, call your health care provider right away, even if it is before your due date. Follow these instructions at home: Medicines  Follow your health care provider's instructions regarding medicine use. Specific medicines may be either safe or unsafe to take during pregnancy.  Take a prenatal vitamin that contains at least 600 micrograms (mcg) of folic acid.  If you develop constipation, try taking a stool softener if your health care provider approves. Eating and drinking  Eat a balanced diet that includes fresh fruits and vegetables, whole grains, good sources of protein  such as meat, eggs, or tofu, and low-fat dairy. Your health care provider will help you determine the amount of weight gain that is right for you.  Avoid raw meat and uncooked cheese. These carry germs that can cause birth defects in the baby.  If you have low calcium intake from food, talk to your health care provider about whether you should take a daily calcium supplement.  Eat four or five small meals rather than three large meals a day.  Limit foods that are high in fat and processed sugars, such as fried and sweet foods.  To prevent constipation: ? Drink enough fluid to keep your urine clear or pale yellow. ? Eat foods that are high in fiber, such as fresh fruits and vegetables, whole grains, and beans. Activity  Exercise only as directed by your health care provider. Most women can continue their usual exercise routine during pregnancy. Try to exercise for 30 minutes at least 5 days a week. Stop exercising if you experience uterine contractions.  Avoid heavy   lifting.  Do not exercise in extreme heat or humidity, or at high altitudes.  Wear low-heel, comfortable shoes.  Practice good posture.  You may continue to have sex unless your health care provider tells you otherwise. Relieving pain and discomfort  Take frequent breaks and rest with your legs elevated if you have leg cramps or low back pain.  Take warm sitz baths to soothe any pain or discomfort caused by hemorrhoids. Use hemorrhoid cream if your health care provider approves.  Wear a good support bra to prevent discomfort from breast tenderness.  If you develop varicose veins: ? Wear support pantyhose or compression stockings as told by your healthcare provider. ? Elevate your feet for 15 minutes, 3-4 times a day. Prenatal care  Write down your questions. Take them to your prenatal visits.  Keep all your prenatal visits as told by your health care provider. This is important. Safety  Wear your seat belt at  all times when driving.  Make a list of emergency phone numbers, including numbers for family, friends, the hospital, and police and fire departments. General instructions  Avoid cat litter boxes and soil used by cats. These carry germs that can cause birth defects in the baby. If you have a cat, ask someone to clean the litter box for you.  Do not travel far distances unless it is absolutely necessary and only with the approval of your health care provider.  Do not use hot tubs, steam rooms, or saunas.  Do not drink alcohol.  Do not use any products that contain nicotine or tobacco, such as cigarettes and e-cigarettes. If you need help quitting, ask your health care provider.  Do not use any medicinal herbs or unprescribed drugs. These chemicals affect the formation and growth of the baby.  Do not douche or use tampons or scented sanitary pads.  Do not cross your legs for long periods of time.  To prepare for the arrival of your baby: ? Take prenatal classes to understand, practice, and ask questions about labor and delivery. ? Make a trial run to the hospital. ? Visit the hospital and tour the maternity area. ? Arrange for maternity or paternity leave through employers. ? Arrange for family and friends to take care of pets while you are in the hospital. ? Purchase a rear-facing car seat and make sure you know how to install it in your car. ? Pack your hospital bag. ? Prepare the baby's nursery. Make sure to remove all pillows and stuffed animals from the baby's crib to prevent suffocation.  Visit your dentist if you have not gone during your pregnancy. Use a soft toothbrush to brush your teeth and be gentle when you floss. Contact a health care provider if:  You are unsure if you are in labor or if your water has broken.  You become dizzy.  You have mild pelvic cramps, pelvic pressure, or nagging pain in your abdominal area.  You have lower back pain.  You have persistent  nausea, vomiting, or diarrhea.  You have an unusual or bad smelling vaginal discharge.  You have pain when you urinate. Get help right away if:  Your water breaks before 37 weeks.  You have regular contractions less than 5 minutes apart before 37 weeks.  You have a fever.  You are leaking fluid from your vagina.  You have spotting or bleeding from your vagina.  You have severe abdominal pain or cramping.  You have rapid weight loss or weight gain.    You have shortness of breath with chest pain.  You notice sudden or extreme swelling of your face, hands, ankles, feet, or legs.  Your baby makes fewer than 10 movements in 2 hours.  You have severe headaches that do not go away when you take medicine.  You have vision changes. Summary  The third trimester is from week 28 through week 40, months 7 through 9. The third trimester is a time when the unborn baby (fetus) is growing rapidly.  During the third trimester, your discomfort may increase as you and your baby continue to gain weight. You may have abdominal, leg, and back pain, sleeping problems, and an increased need to urinate.  During the third trimester your breasts will keep growing and they will continue to become tender. A yellow fluid (colostrum) may leak from your breasts. This is the first milk you are producing for your baby.  False labor is a condition in which you feel small, irregular tightenings of the muscles in the womb (contractions) that eventually go away. These are called Braxton Hicks contractions. Contractions may last for hours, days, or even weeks before true labor sets in.  Signs of labor can include: abdominal cramps; regular contractions that start at 10 minutes apart and become stronger and more frequent with time; watery or bloody mucus discharge that comes from the vagina; increased pelvic pressure and dull back pain; and leaking of amniotic fluid. This information is not intended to replace advice  given to you by your health care provider. Make sure you discuss any questions you have with your health care provider. Document Released: 06/26/2001 Document Revised: 12/08/2015 Document Reviewed: 09/02/2012 Elsevier Interactive Patient Education  2017 [employer]  

## 2016-12-20 NOTE — Progress Notes (Signed)
   PRENATAL VISIT NOTE  Subjective:  Shelley Hardy is a 41 y.o. [id number] at [redacted]w[redacted]d being seen today for ongoing prenatal care.  She is currently monitored for the following issues for this high-risk pregnancy and has Prolactinoma ([place]); Goiter; H/O: pituitary tumor; Depression; Hypertriglyceridemia; AMA (advanced maternal age) multigravida 88+; Supervision of elderly multigravida (>=15 years old at time of delivery); and Aborter, habitual, antepartum on her problem list.  Patient reports hemorrhoid and pelvic support problem.  Contractions: Not present. Vag. Bleeding: None.  Movement: Present. Denies leaking of fluid.   The following portions of the patient's history were reviewed and updated as appropriate: allergies, current medications, past family history, past medical history, past social history, past surgical history and problem list. Problem list updated.  Objective:   Vitals:   12/20/16 1540  BP: 104/70  Pulse: 92  Weight: 188 lb (85.3 kg)    Fetal Status: Fetal Heart Rate (bpm): 130   Movement: Present     General:  Alert, oriented and cooperative. Patient is in no acute distress.  Skin: Skin is warm and dry. No rash noted.   Cardiovascular: Normal heart rate noted  Respiratory: Normal respiratory effort, no problems with respiration noted  Abdomen: Soft, gravid, appropriate for gestational age. Pain/Pressure: Present     Pelvic:  Cervical exam deferred        Extremities: Normal range of motion.  Edema: None  Mental Status: Normal mood and affect. Normal behavior. Normal judgment and thought content.   Assessment and Plan:  Pregnancy: [id number] at [redacted]w[redacted]d  1. Encounter for supervision of multigravida of advanced maternal age in third trimester thrombocytopenia - CBC  2. Elderly multigravida in third trimester hemorrhoid - hydrocortisone (ANUSOL-HC) 25 MG suppository; Place 1 suppository (25 mg total) rectally 2 (two) times daily.  Dispense: 12 suppository; Refill:  1  Preterm labor symptoms and general obstetric precautions including but not limited to vaginal bleeding, contractions, leaking of fluid and fetal movement were reviewed in detail with the patient. Please refer to After Visit Summary for other counseling recommendations.  Return in about 1 week (around 12/27/2016). BPP 8/8 yesterday and f/u is scheduled  Emeterio Reeve, MD

## 2016-12-21 LAB — CBC
HEMATOCRIT: 30.7 % — AB (ref 34.0–46.6)
HEMOGLOBIN: 10.2 g/dL — AB (ref 11.1–15.9)
MCH: 29.9 pg (ref 26.6–33.0)
MCHC: 33.2 g/dL (ref 31.5–35.7)
MCV: 90 fL (ref 79–97)
Platelets: 129 10*3/uL — ABNORMAL LOW (ref 150–379)
RBC: 3.41 x10E6/uL — ABNORMAL LOW (ref 3.77–5.28)
RDW: 13.4 % (ref 12.3–15.4)
WBC: 6.2 10*3/uL (ref 3.4–10.8)

## 2016-12-26 ENCOUNTER — Ambulatory Visit (HOSPITAL_COMMUNITY)
Admission: RE | Admit: 2016-12-26 | Discharge: 2016-12-26 | Disposition: A | Discharge status: Home / Self Care | Payer: Medicaid Other | Source: Ambulatory Visit | Attending: Obstetrics and Gynecology | Admitting: Obstetrics and Gynecology

## 2016-12-26 ENCOUNTER — Other Ambulatory Visit (HOSPITAL_COMMUNITY): Payer: Self-pay | Admitting: Obstetrics and Gynecology

## 2016-12-26 ENCOUNTER — Encounter: Payer: Medicaid Other | Admitting: Obstetrics & Gynecology

## 2016-12-26 DIAGNOSIS — O359XX0 Maternal care for (suspected) fetal abnormality and damage, unspecified, not applicable or unspecified: Secondary | ICD-10-CM | POA: Diagnosis not present

## 2016-12-26 DIAGNOSIS — Z3A35 35 weeks gestation of pregnancy: Secondary | ICD-10-CM | POA: Insufficient documentation

## 2016-12-26 DIAGNOSIS — O09523 Supervision of elderly multigravida, third trimester: Secondary | ICD-10-CM | POA: Insufficient documentation

## 2016-12-27 ENCOUNTER — Encounter: Payer: Medicaid Other | Admitting: Obstetrics and Gynecology

## 2016-12-28 ENCOUNTER — Other Ambulatory Visit: Payer: Self-pay | Admitting: Obstetrics and Gynecology

## 2016-12-28 DIAGNOSIS — O09521 Supervision of elderly multigravida, first trimester: Secondary | ICD-10-CM

## 2017-01-02 ENCOUNTER — Ambulatory Visit (HOSPITAL_COMMUNITY)
Admission: RE | Admit: 2017-01-02 | Discharge: 2017-01-02 | Disposition: A | Discharge status: Home / Self Care | Payer: Medicaid Other | Source: Ambulatory Visit | Attending: Obstetrics and Gynecology | Admitting: Obstetrics and Gynecology

## 2017-01-02 ENCOUNTER — Encounter (HOSPITAL_COMMUNITY): Payer: Self-pay

## 2017-01-02 DIAGNOSIS — Z3A36 36 weeks gestation of pregnancy: Secondary | ICD-10-CM | POA: Diagnosis not present

## 2017-01-02 DIAGNOSIS — O09529 Supervision of elderly multigravida, unspecified trimester: Secondary | ICD-10-CM

## 2017-01-02 DIAGNOSIS — O09523 Supervision of elderly multigravida, third trimester: Secondary | ICD-10-CM | POA: Insufficient documentation

## 2017-01-03 ENCOUNTER — Ambulatory Visit (INDEPENDENT_AMBULATORY_CARE_PROVIDER_SITE_OTHER): Payer: Medicaid Other | Admitting: Obstetrics and Gynecology

## 2017-01-03 ENCOUNTER — Other Ambulatory Visit (HOSPITAL_COMMUNITY)
Admission: RE | Admit: 2017-01-03 | Discharge: 2017-01-03 | Disposition: A | Discharge status: Home / Self Care | Payer: Medicaid Other | Source: Ambulatory Visit | Attending: Obstetrics and Gynecology | Admitting: Obstetrics and Gynecology

## 2017-01-03 ENCOUNTER — Encounter: Payer: Self-pay | Admitting: Obstetrics

## 2017-01-03 VITALS — BP 108/71 | HR 103 | Wt 187.0 lb

## 2017-01-03 DIAGNOSIS — O09523 Supervision of elderly multigravida, third trimester: Secondary | ICD-10-CM | POA: Insufficient documentation

## 2017-01-03 DIAGNOSIS — Z3A36 36 weeks gestation of pregnancy: Secondary | ICD-10-CM | POA: Insufficient documentation

## 2017-01-03 DIAGNOSIS — Z3493 Encounter for supervision of normal pregnancy, unspecified, third trimester: Secondary | ICD-10-CM

## 2017-01-03 LAB — OB RESULTS CONSOLE GBS: STREP GROUP B AG: NEGATIVE

## 2017-01-03 NOTE — Progress Notes (Signed)
   PRENATAL VISIT NOTE  Subjective:  Shelley Hardy is a 41 y.o. [id number] at [redacted]w[redacted]d being seen today for ongoing prenatal care.  She is currently monitored for the following issues for this high-risk pregnancy and has Prolactinoma ([place]); Goiter; H/O: pituitary tumor; Depression; Hypertriglyceridemia; AMA (advanced maternal age) multigravida 42+; Supervision of elderly multigravida (>=35 years old at time of delivery); and Aborter, habitual, antepartum on her problem list.  Patient reports no complaints.  Contractions: Not present. Vag. Bleeding: None.  Movement: Present. Denies leaking of fluid.   The following portions of the patient's history were reviewed and updated as appropriate: allergies, current medications, past family history, past medical history, past social history, past surgical history and problem list. Problem list updated.  Objective:   Vitals:   01/03/17 1006  BP: 108/71  Pulse: (!) 103  Weight: 187 lb (84.8 kg)    Fetal Status: Fetal Heart Rate (bpm): 140 Fundal Height: 36 cm Movement: Present  Presentation: Vertex  General:  Alert, oriented and cooperative. Patient is in no acute distress.  Skin: Skin is warm and dry. No rash noted.   Cardiovascular: Normal heart rate noted  Respiratory: Normal respiratory effort, no problems with respiration noted  Abdomen: Soft, gravid, appropriate for gestational age. Pain/Pressure: Present     Pelvic:  Cervical exam performed Dilation: 1 Effacement (%): Thick Station: Ballotable  Extremities: Normal range of motion.  Edema: None  Mental Status: Normal mood and affect. Normal behavior. Normal judgment and thought content.   Assessment and Plan:  Pregnancy: [id number] at [redacted]w[redacted]d  1. Encounter for supervision of multigravida of advanced maternal age in third trimester Patient is doing well without complaints Cultures today BPP 8/8 yesterday - Strep Gp B NAA - Cervicovaginal ancillary only  2. Elderly multigravida in third  trimester Low risk NIPS BPP 8/8 yesterday Fetal testing not done today because the patient is unable to stay Plan for IOL at 40 weeks  Preterm labor symptoms and general obstetric precautions including but not limited to vaginal bleeding, contractions, leaking of fluid and fetal movement were reviewed in detail with the patient. Please refer to After Visit Summary for other counseling recommendations.  No Follow-up on file.   Mora Bellman, MD

## 2017-01-04 LAB — CERVICOVAGINAL ANCILLARY ONLY
Bacterial vaginitis: NEGATIVE
CANDIDA VAGINITIS: NEGATIVE
CHLAMYDIA, DNA PROBE: NEGATIVE
Neisseria Gonorrhea: NEGATIVE
TRICH (WINDOWPATH): NEGATIVE

## 2017-01-05 LAB — STREP GP B NAA: Strep Gp B NAA: NEGATIVE

## 2017-01-07 ENCOUNTER — Encounter: Payer: Medicaid Other | Admitting: Obstetrics and Gynecology

## 2017-01-10 ENCOUNTER — Ambulatory Visit (INDEPENDENT_AMBULATORY_CARE_PROVIDER_SITE_OTHER): Payer: Medicaid Other | Admitting: Certified Nurse Midwife

## 2017-01-10 DIAGNOSIS — O09523 Supervision of elderly multigravida, third trimester: Secondary | ICD-10-CM

## 2017-01-10 NOTE — Progress Notes (Signed)
Patient is in the office for NST.

## 2017-01-10 NOTE — Progress Notes (Signed)
S: no complaints  O: NST: + accels, no decels, moderate variability, Cat. 1 tracing. No contractions on toco.  Cervix:  1 cm, posterior, medium, -3 station, 20% effaced.   A: AMA, high risk pregnancy     P: F/U HOB 1 week with NST, BPP ordered for next week.

## 2017-01-15 ENCOUNTER — Encounter: Payer: Self-pay | Admitting: *Deleted

## 2017-01-17 ENCOUNTER — Encounter: Payer: Self-pay | Admitting: Obstetrics

## 2017-01-17 ENCOUNTER — Ambulatory Visit (INDEPENDENT_AMBULATORY_CARE_PROVIDER_SITE_OTHER): Payer: Medicaid Other | Admitting: Obstetrics & Gynecology

## 2017-01-17 ENCOUNTER — Encounter: Payer: Self-pay | Admitting: Obstetrics & Gynecology

## 2017-01-17 VITALS — BP 111/71 | HR 92 | Wt 191.0 lb

## 2017-01-17 DIAGNOSIS — O09523 Supervision of elderly multigravida, third trimester: Secondary | ICD-10-CM | POA: Diagnosis not present

## 2017-01-17 NOTE — Progress Notes (Signed)
NST reactive today   PRENATAL VISIT NOTE  Subjective:  Shelley Hardy is a 41 y.o. [id number] at [redacted]w[redacted]d being seen today for ongoing prenatal care.  She is currently monitored for the following issues for this high-risk pregnancy and has Prolactinoma ([place]); Goiter; H/O: pituitary tumor; Depression; Hypertriglyceridemia; AMA (advanced maternal age) multigravida 21+; Supervision of elderly multigravida (>=69 years old at time of delivery); and Aborter, habitual, antepartum on her problem list.  Patient reports occasional contractions.  Contractions: Irregular. Vag. Bleeding: None.  Movement: Present. Denies leaking of fluid.   The following portions of the patient's history were reviewed and updated as appropriate: allergies, current medications, past family history, past medical history, past social history, past surgical history and problem list. Problem list updated.  Objective:   Vitals:   01/17/17 1431  BP: 111/71  Pulse: 92  Weight: 191 lb (86.6 kg)    Fetal Status: Fetal Heart Rate (bpm): NST  Fundal Height: 37 cm Movement: Present     General:  Alert, oriented and cooperative. Patient is in no acute distress.  Skin: Skin is warm and dry. No rash noted.   Cardiovascular: Normal heart rate noted  Respiratory: Normal respiratory effort, no problems with respiration noted  Abdomen: Soft, gravid, appropriate for gestational age. Pain/Pressure: Present     Pelvic:  Cervical exam deferred        Extremities: Normal range of motion.  Edema: None  Mental Status: Normal mood and affect. Normal behavior. Normal judgment and thought content.   Assessment and Plan:  Pregnancy: [id number] at [redacted]w[redacted]d  1. Encounter for supervision of multigravida of advanced maternal age in third trimester Normal growth on [place] 2 weeks ago  Term labor symptoms and general obstetric precautions including but not limited to vaginal bleeding, contractions, leaking of fluid and fetal movement were reviewed in  detail with the patient. Please refer to After Visit Summary for other counseling recommendations.  Return in about 1 week (around 01/24/2017). BPP is scheduled in 4 days  Emeterio Reeve, MD

## 2017-01-17 NOTE — Patient Instructions (Signed)
Vaginal Delivery Vaginal delivery means that you will give birth by pushing your baby out of your birth canal (vagina). A team of health care providers will help you before, during, and after vaginal delivery. Birth experiences are unique for every woman and every pregnancy, and birth experiences vary depending on where you choose to give birth. What should I do to prepare for my baby's birth? Before your baby is born, it is important to talk with your health care provider about:  Your labor and delivery preferences. These may include: ? Medicines that you may be given. ? How you will manage your pain. This might include non-medical pain relief techniques or injectable pain relief such as epidural analgesia. ? How you and your baby will be monitored during labor and delivery. ? Who may be in the labor and delivery room with you. ? Your feelings about surgical delivery of your baby (cesarean delivery, or C-section) if this becomes necessary. ? Your feelings about receiving donated blood through an IV tube (blood transfusion) if this becomes necessary.  Whether you are able: ? To take pictures or videos of the birth. ? To eat during labor and delivery. ? To move around, walk, or change positions during labor and delivery.  What to expect after your baby is born, such as: ? Whether delayed umbilical cord clamping and cutting is offered. ? Who will care for your baby right after birth. ? Medicines or tests that may be recommended for your baby. ? Whether breastfeeding is supported in your hospital or birth center. ? How long you will be in the hospital or birth center.  How any medical conditions you have may affect your baby or your labor and delivery experience.  To prepare for your baby's birth, you should also:  Attend all of your health care visits before delivery (prenatal visits) as recommended by your health care provider. This is important.  Prepare your home for your baby's  arrival. Make sure that you have: ? Diapers. ? Baby clothing. ? Feeding equipment. ? Safe sleeping arrangements for you and your baby.  Install a car seat in your vehicle. Have your car seat checked by a certified car seat installer to make sure that it is installed safely.  Think about who will help you with your new baby at home for at least the first several weeks after delivery.  What can I expect when I arrive at the birth center or hospital? Once you are in labor and have been admitted into the hospital or birth center, your health care provider may:  Review your pregnancy history and any concerns you have.  Insert an IV tube into one of your veins. This is used to give you fluids and medicines.  Check your blood pressure, pulse, temperature, and heart rate (vital signs).  Check whether your bag of water (amniotic sac) has broken (ruptured).  Talk with you about your birth plan and discuss pain control options.  Monitoring Your health care provider may monitor your contractions (uterine monitoring) and your baby's heart rate (fetal monitoring). You may need to be monitored:  Often, but not continuously (intermittently).  All the time or for long periods at a time (continuously). Continuous monitoring may be needed if: ? You are taking certain medicines, such as medicine to relieve pain or make your contractions stronger. ? You have pregnancy or labor complications.  Monitoring may be done by:  Placing a special stethoscope or a handheld monitoring device on your abdomen to   check your baby's heartbeat, and feeling your abdomen for contractions. This method of monitoring does not continuously record your baby's heartbeat or your contractions.  Placing monitors on your abdomen (external monitors) to record your baby's heartbeat and the frequency and length of contractions. You may not have to wear external monitors all the time.  Placing monitors inside of your uterus  (internal monitors) to record your baby's heartbeat and the frequency, length, and strength of your contractions. ? Your health care provider may use internal monitors if he or she needs more information about the strength of your contractions or your baby's heart rate. ? Internal monitors are put in place by passing a thin, flexible wire through your vagina and into your uterus. Depending on the type of monitor, it may remain in your uterus or on your baby's head until birth. ? Your health care provider will discuss the benefits and risks of internal monitoring with you and will ask for your permission before inserting the monitors.  Telemetry. This is a type of continuous monitoring that can be done with external or internal monitors. Instead of having to stay in bed, you are able to move around during telemetry. Ask your health care provider if telemetry is an option for you.  Physical exam Your health care provider may perform a physical exam. This may include:  Checking whether your baby is positioned: ? With the head toward your vagina (head-down). This is most common. ? With the head toward the top of your uterus (head-up or breech). If your baby is in a breech position, your health care provider may try to turn your baby to a head-down position so you can deliver vaginally. If it does not seem that your baby can be born vaginally, your provider may recommend surgery to deliver your baby. In rare cases, you may be able to deliver vaginally if your baby is head-up (breech delivery). ? Lying sideways (transverse). Babies that are lying sideways cannot be delivered vaginally.  Checking your cervix to determine: ? Whether it is thinning out (effacing). ? Whether it is opening up (dilating). ? How low your baby has moved into your birth canal.  What are the three stages of labor and delivery?  Normal labor and delivery is divided into the following three stages: Stage 1  Stage 1 is the  longest stage of labor, and it can last for hours or days. Stage 1 includes: ? Early labor. This is when contractions may be irregular, or regular and mild. Generally, early labor contractions are more than 10 minutes apart. ? Active labor. This is when contractions get longer, more regular, more frequent, and more intense. ? The transition phase. This is when contractions happen very close together, are very intense, and may last longer than during any other part of labor.  Contractions generally feel mild, infrequent, and irregular at first. They get stronger, more frequent (about every 2-3 minutes), and more regular as you progress from early labor through active labor and transition.  Many women progress through stage 1 naturally, but you may need help to continue making progress. If this happens, your health care provider may talk with you about: ? Rupturing your amniotic sac if it has not ruptured yet. ? Giving you medicine to help make your contractions stronger and more frequent.  Stage 1 ends when your cervix is completely dilated to 4 inches (10 cm) and completely effaced. This happens at the end of the transition phase. Stage 2  Once   your cervix is completely effaced and dilated to 4 inches (10 cm), you may start to feel an urge to push. It is common for the body to naturally take a rest before feeling the urge to push, especially if you received an epidural or certain other pain medicines. This rest period may last for up to 1-2 hours, depending on your unique labor experience.  During stage 2, contractions are generally less painful, because pushing helps relieve contraction pain. Instead of contraction pain, you may feel stretching and burning pain, especially when the widest part of your baby's head passes through the vaginal opening (crowning).  Your health care provider will closely monitor your pushing progress and your baby's progress through the vagina during stage 2.  Your  health care provider may massage the area of skin between your vaginal opening and anus (perineum) or apply warm compresses to your perineum. This helps it stretch as the baby's head starts to crown, which can help prevent perineal tearing. ? In some cases, an incision may be made in your perineum (episiotomy) to allow the baby to pass through the vaginal opening. An episiotomy helps to make the opening of the vagina larger to allow more room for the baby to fit through.  It is very important to breathe and focus so your health care provider can control the delivery of your baby's head. Your health care provider may have you decrease the intensity of your pushing, to help prevent perineal tearing.  After delivery of your baby's head, the shoulders and the rest of the body generally deliver very quickly and without difficulty.  Once your baby is delivered, the umbilical cord may be cut right away, or this may be delayed for 1-2 minutes, depending on your baby's health. This may vary among health care providers, hospitals, and birth centers.  If you and your baby are healthy enough, your baby may be placed on your chest or abdomen to help maintain the baby's temperature and to help you bond with each other. Some mothers and babies start breastfeeding at this time. Your health care team will dry your baby and help keep your baby warm during this time.  Your baby may need immediate care if he or she: ? Showed signs of distress during labor. ? Has a medical condition. ? Was born too early (prematurely). ? Had a bowel movement before birth (meconium). ? Shows signs of difficulty transitioning from being inside the uterus to being outside of the uterus. If you are planning to breastfeed, your health care team will help you begin a feeding. Stage 3  The third stage of labor starts immediately after the birth of your baby and ends after you deliver the placenta. The placenta is an organ that develops  during pregnancy to provide oxygen and nutrients to your baby in the womb.  Delivering the placenta may require some pushing, and you may have mild contractions. Breastfeeding can stimulate contractions to help you deliver the placenta.  After the placenta is delivered, your uterus should tighten (contract) and become firm. This helps to stop bleeding in your uterus. To help your uterus contract and to control bleeding, your health care provider may: ? Give you medicine by injection, through an IV tube, by mouth, or through your rectum (rectally). ? Massage your abdomen or perform a vaginal exam to remove any blood clots that are left in your uterus. ? Empty your bladder by placing a thin, flexible tube (catheter) into your bladder. ? Encourage   you to breastfeed your baby. After labor is over, you and your baby will be monitored closely to ensure that you are both healthy until you are ready to go home. Your health care team will teach you how to care for yourself and your baby. This information is not intended to replace advice given to you by your health care provider. Make sure you discuss any questions you have with your health care provider. Document Released: 04/10/2008 Document Revised: 01/20/2016 Document Reviewed: 07/17/2015 Elsevier Interactive Patient Education  2018 [employer]  

## 2017-01-21 ENCOUNTER — Ambulatory Visit (HOSPITAL_COMMUNITY)
Admission: RE | Admit: 2017-01-21 | Discharge: 2017-01-21 | Disposition: A | Discharge status: Home / Self Care | Payer: Medicaid Other | Source: Ambulatory Visit | Attending: Certified Nurse Midwife | Admitting: Certified Nurse Midwife

## 2017-01-21 ENCOUNTER — Other Ambulatory Visit: Payer: Self-pay | Admitting: Certified Nurse Midwife

## 2017-01-21 DIAGNOSIS — O09523 Supervision of elderly multigravida, third trimester: Secondary | ICD-10-CM | POA: Diagnosis not present

## 2017-01-21 DIAGNOSIS — O358XX0 Maternal care for other (suspected) fetal abnormality and damage, not applicable or unspecified: Secondary | ICD-10-CM | POA: Diagnosis not present

## 2017-01-21 DIAGNOSIS — Z3A38 38 weeks gestation of pregnancy: Secondary | ICD-10-CM

## 2017-01-24 ENCOUNTER — Encounter: Payer: Medicaid Other | Admitting: Obstetrics & Gynecology

## 2017-01-24 ENCOUNTER — Inpatient Hospital Stay (HOSPITAL_COMMUNITY)
Admission: AD | Admit: 2017-01-24 | Discharge: 2017-01-24 | Disposition: A | Discharge status: Home / Self Care | Payer: Medicaid Other | Source: Ambulatory Visit | Attending: Obstetrics & Gynecology | Admitting: Obstetrics & Gynecology

## 2017-01-24 ENCOUNTER — Encounter (HOSPITAL_COMMUNITY): Payer: Self-pay

## 2017-01-24 DIAGNOSIS — O479 False labor, unspecified: Secondary | ICD-10-CM

## 2017-01-24 DIAGNOSIS — O26893 Other specified pregnancy related conditions, third trimester: Secondary | ICD-10-CM | POA: Insufficient documentation

## 2017-01-24 DIAGNOSIS — R102 Pelvic and perineal pain: Secondary | ICD-10-CM | POA: Insufficient documentation

## 2017-01-24 DIAGNOSIS — Z3A39 39 weeks gestation of pregnancy: Secondary | ICD-10-CM | POA: Diagnosis not present

## 2017-01-24 LAB — URINALYSIS, ROUTINE W REFLEX MICROSCOPIC
Bilirubin Urine: NEGATIVE
GLUCOSE, UA: NEGATIVE mg/dL
HGB URINE DIPSTICK: NEGATIVE
KETONES UR: NEGATIVE mg/dL
LEUKOCYTES UA: NEGATIVE
Nitrite: NEGATIVE
PH: 7 (ref 5.0–8.0)
PROTEIN: NEGATIVE mg/dL
Specific Gravity, Urine: 1.013 (ref 1.005–1.030)

## 2017-01-24 NOTE — MAU Note (Signed)
Pt. States that pelvic pressure began x 2 days ago. Pt lost mucous plug x 2 days ago. Pt denies bleeding.

## 2017-01-24 NOTE — MAU Note (Signed)
Pt reports she is having increased pelvic pressure. Reports some contractions but they are not regular. Good fetal movement felt. State she had a lot of mucusy discharge the other day mixed with blood(mucus plug) that came out.

## 2017-01-25 ENCOUNTER — Encounter: Payer: Self-pay | Admitting: Obstetrics and Gynecology

## 2017-01-25 ENCOUNTER — Other Ambulatory Visit: Payer: Self-pay | Admitting: Advanced Practice Midwife

## 2017-01-25 ENCOUNTER — Telehealth (HOSPITAL_COMMUNITY): Payer: Self-pay | Admitting: *Deleted

## 2017-01-25 ENCOUNTER — Encounter (HOSPITAL_COMMUNITY): Payer: Self-pay | Admitting: *Deleted

## 2017-01-25 ENCOUNTER — Ambulatory Visit (INDEPENDENT_AMBULATORY_CARE_PROVIDER_SITE_OTHER): Payer: Medicaid Other | Admitting: Obstetrics and Gynecology

## 2017-01-25 VITALS — BP 106/72 | HR 97 | Wt 186.0 lb

## 2017-01-25 DIAGNOSIS — O320XX0 Maternal care for unstable lie, not applicable or unspecified: Secondary | ICD-10-CM | POA: Insufficient documentation

## 2017-01-25 DIAGNOSIS — O99119 Other diseases of the blood and blood-forming organs and certain disorders involving the immune mechanism complicating pregnancy, unspecified trimester: Secondary | ICD-10-CM

## 2017-01-25 DIAGNOSIS — D352 Benign neoplasm of pituitary gland: Secondary | ICD-10-CM

## 2017-01-25 DIAGNOSIS — D696 Thrombocytopenia, unspecified: Secondary | ICD-10-CM | POA: Insufficient documentation

## 2017-01-25 DIAGNOSIS — O09523 Supervision of elderly multigravida, third trimester: Secondary | ICD-10-CM

## 2017-01-25 DIAGNOSIS — O99113 Other diseases of the blood and blood-forming organs and certain disorders involving the immune mechanism complicating pregnancy, third trimester: Secondary | ICD-10-CM

## 2017-01-25 DIAGNOSIS — E049 Nontoxic goiter, unspecified: Secondary | ICD-10-CM

## 2017-01-25 NOTE — Telephone Encounter (Signed)
Preadmission screen  

## 2017-01-25 NOTE — Progress Notes (Addendum)
Prenatal Visit Note Date: 01/25/2017 Clinic: [facility]  Subjective:  Eniya Cannady Winne is a 41 y.o. [id number] at [redacted]w[redacted]d being seen today for ongoing prenatal care.  She is currently monitored for the following issues for this high-risk pregnancy and has Prolactinoma ([place]); Goiter; H/O: pituitary tumor; Depression; Hypertriglyceridemia; AMA (advanced maternal age) multigravida 42+; Supervision of elderly multigravida (>=23 years old at time of delivery); Aborter, habitual, antepartum; and Gestational thrombocytopenia ([place]) on her problem list.  Patient reports no complaints.   Contractions: Irregular. Vag. Bleeding: None.  Movement: Present. Denies leaking of fluid.   The following portions of the patient's history were reviewed and updated as appropriate: allergies, current medications, past family history, past medical history, past social history, past surgical history and problem list. Problem list updated.  Objective:   Vitals:   01/25/17 0937  BP: 106/72  Pulse: 97  Weight: 186 lb (84.4 kg)    Fetal Status: Fetal Heart Rate (bpm): 134 Fundal Height: 39 cm Movement: Present  Presentation: Vertex  General:  Alert, oriented and cooperative. Patient is in no acute distress.  Skin: Skin is warm and dry. No rash noted.   Cardiovascular: Normal heart rate noted  Respiratory: Normal respiratory effort, no problems with respiration noted  Abdomen: Soft, gravid, appropriate for gestational age. Pain/Pressure: Present     Pelvic:  Cervical exam deferred        Extremities: Normal range of motion.  Edema: None  Mental Status: Normal mood and affect. Normal behavior. Normal judgment and thought content.   Urinalysis:      Assessment and Plan:  Pregnancy: [id number] at [redacted]w[redacted]d  1. Prolactinoma ([place]) Pt not on anything. Not reliable to check levels during pregnancy. Pt told to contact [place] endo for late August (approx 6wks post partum) for follow up visit. Her MD there  was CC'ed on today's note  2. Goiter Will check levels today. No compression s/s. Pt not on anything for thyroid - TSH - T4, free  3. Benign gestational thrombocytopenia in third trimester ([place]) Recheck today.  - CBC  4. Elderly multigravida in third trimester Negative MAU labor eval with rNST and 1cm/40%. Borderline LGA with large AC on 6/20 growth u/s. Normal BPP on 7/9. Will rpt BPP on 7/16 and will set up for PM 7/16 IOl or 7/17 IOL - [place] MFM FETAL BPP WO NON STRESS; Future  5. Pregnancy Patient doesn't want to do anything for [facility] b/c she states that when she restarts her hyper PRL med she doesn't have a period. Can d/w pt more at [facility] visit  Term labor symptoms and general obstetric precautions including but not limited to vaginal bleeding, contractions, leaking of fluid and fetal movement were reviewed in detail with the patient. Please refer to After Visit Summary for other counseling recommendations.  Return in about 4 weeks (around 02/22/2017) for PP visit.   Aletha Halim, MD

## 2017-01-26 LAB — TSH: TSH: 0.452 u[IU]/mL (ref 0.450–4.500)

## 2017-01-26 LAB — T4, FREE: Free T4: 1.1 ng/dL (ref 0.82–1.77)

## 2017-01-26 LAB — CBC
Hematocrit: 32.9 % — ABNORMAL LOW (ref 34.0–46.6)
Hemoglobin: 10.6 g/dL — ABNORMAL LOW (ref 11.1–15.9)
MCH: 28.4 pg (ref 26.6–33.0)
MCHC: 32.2 g/dL (ref 31.5–35.7)
MCV: 88 fL (ref 79–97)
PLATELETS: 120 10*3/uL — AB (ref 150–379)
RBC: 3.73 x10E6/uL — AB (ref 3.77–5.28)
RDW: 14 % (ref 12.3–15.4)
WBC: 5.4 10*3/uL (ref 3.4–10.8)

## 2017-01-28 ENCOUNTER — Encounter: Payer: Medicaid Other | Admitting: Certified Nurse Midwife

## 2017-01-28 ENCOUNTER — Encounter (HOSPITAL_COMMUNITY): Payer: Self-pay

## 2017-01-28 ENCOUNTER — Ambulatory Visit (HOSPITAL_COMMUNITY)
Admission: RE | Admit: 2017-01-28 | Discharge: 2017-01-28 | Disposition: A | Discharge status: Home / Self Care | Payer: Medicaid Other | Source: Ambulatory Visit | Attending: Obstetrics and Gynecology | Admitting: Obstetrics and Gynecology

## 2017-01-28 ENCOUNTER — Other Ambulatory Visit: Payer: Self-pay | Admitting: Obstetrics and Gynecology

## 2017-01-28 DIAGNOSIS — O358XX0 Maternal care for other (suspected) fetal abnormality and damage, not applicable or unspecified: Secondary | ICD-10-CM | POA: Insufficient documentation

## 2017-01-28 DIAGNOSIS — O09523 Supervision of elderly multigravida, third trimester: Secondary | ICD-10-CM

## 2017-01-28 DIAGNOSIS — Z3A39 39 weeks gestation of pregnancy: Secondary | ICD-10-CM

## 2017-01-28 IMAGING — US US MFM FETAL BPP W/O NON-STRESS
1 series · 13 of 24 positions shown · non-contrast
Comparison: none

[Series 1: us mfm fetal bpp w/o non-stress · 24 acquisitions, 13 frames shown]
[im 1/24]
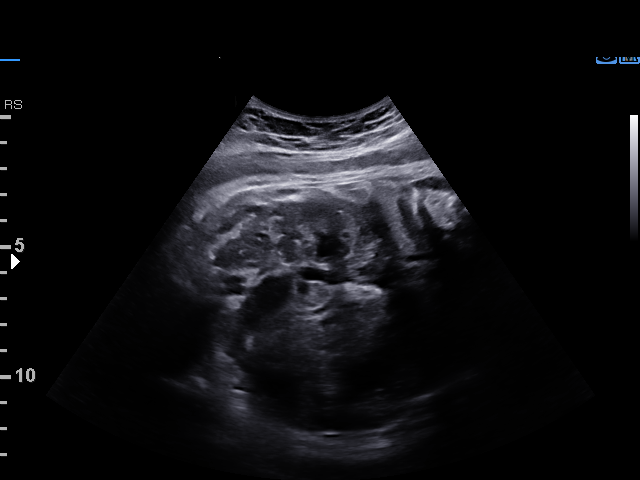
[im 3/24]
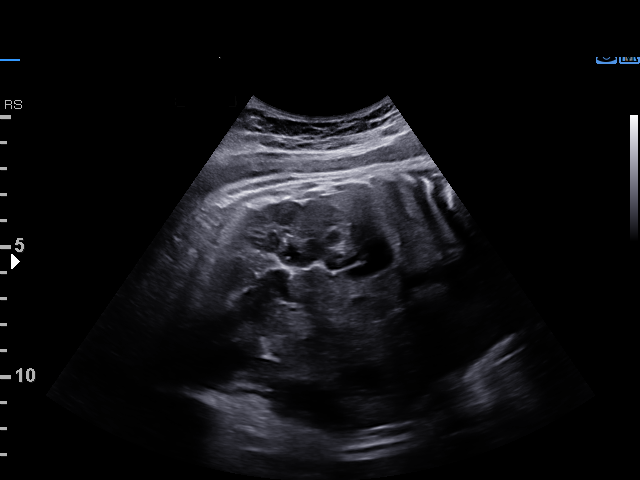
[im 5/24]
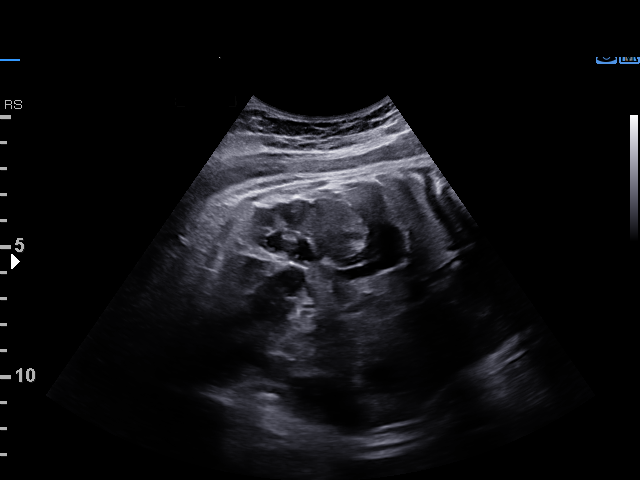
[im 7/24]
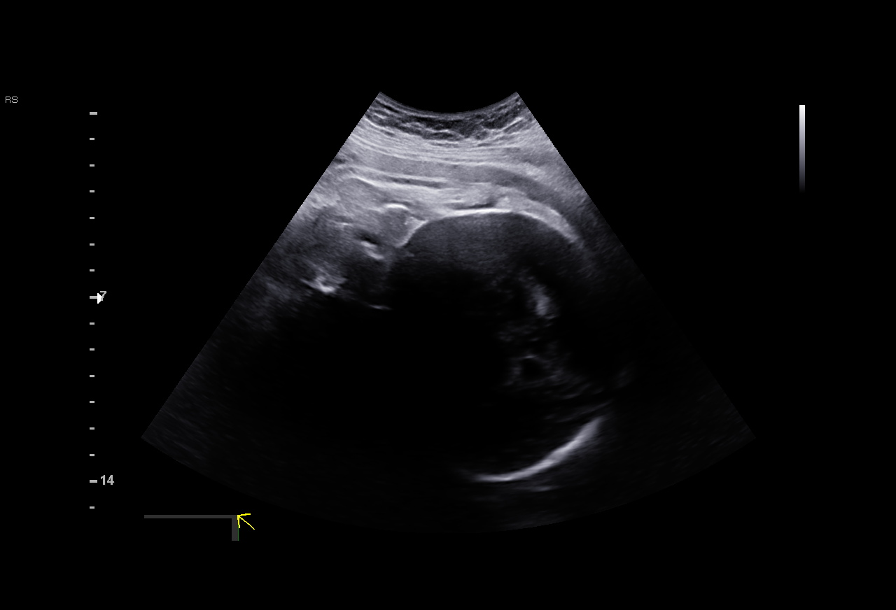
[im 9/24]
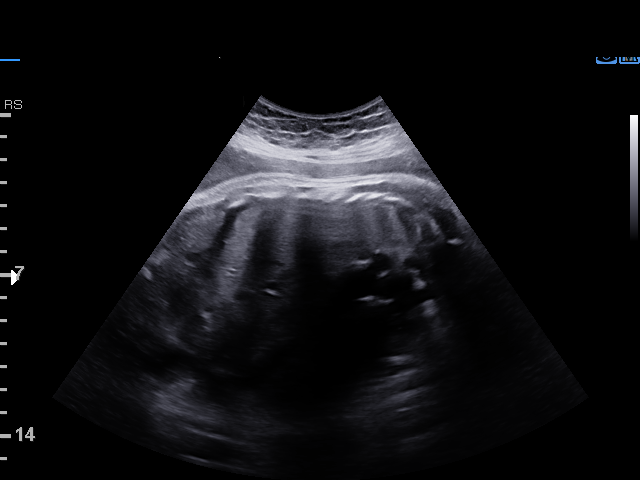
[im 11/24]
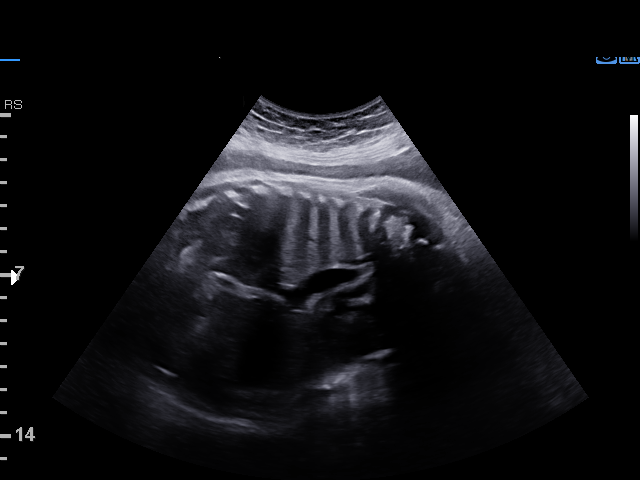
[im 13/24]
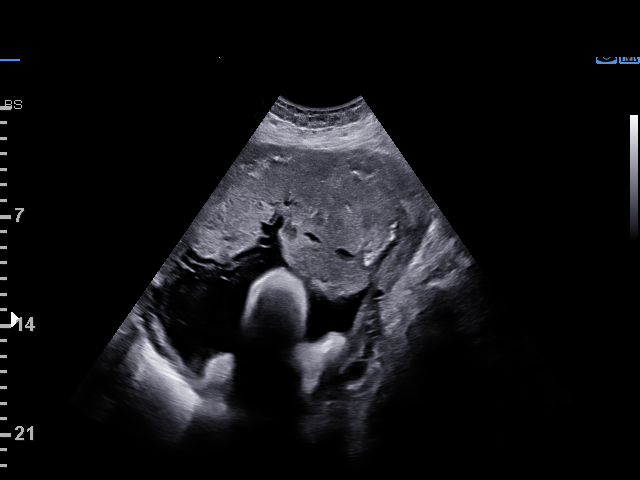
[im 14/24]
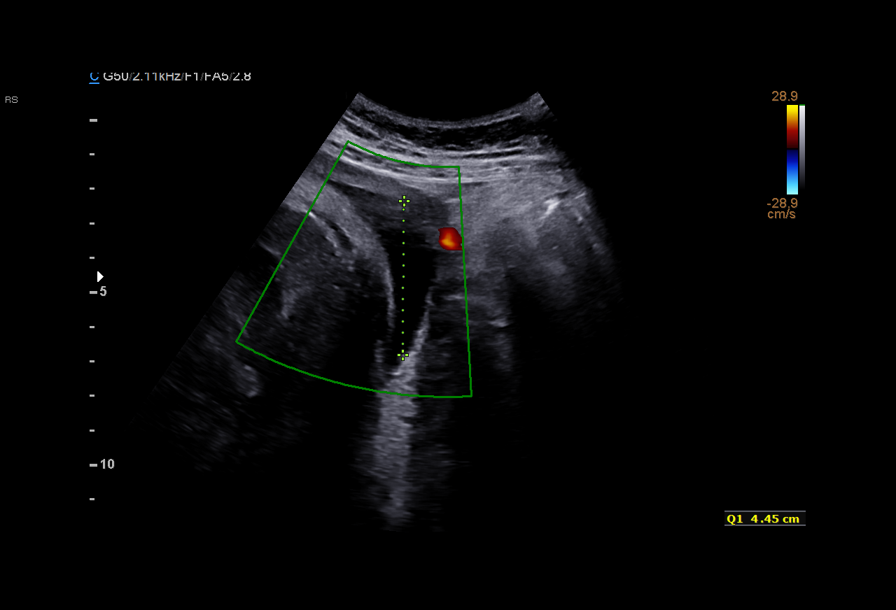
[im 16/24]
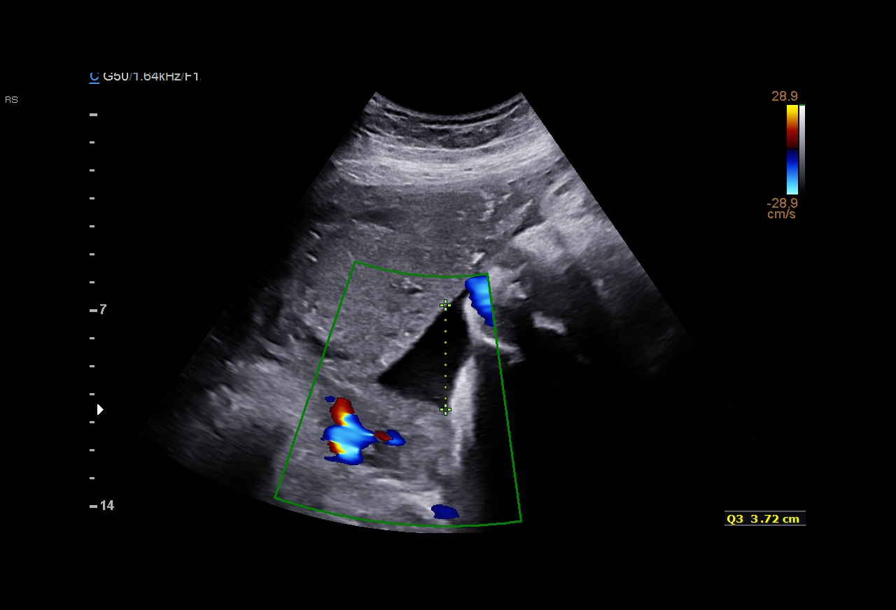
[im 18/24]
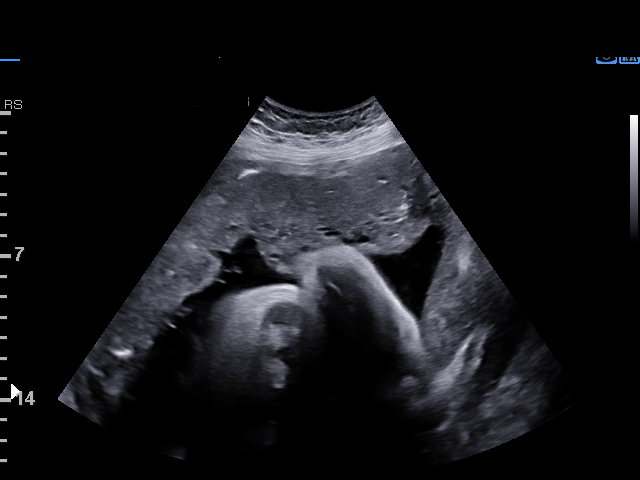
[im 20/24]
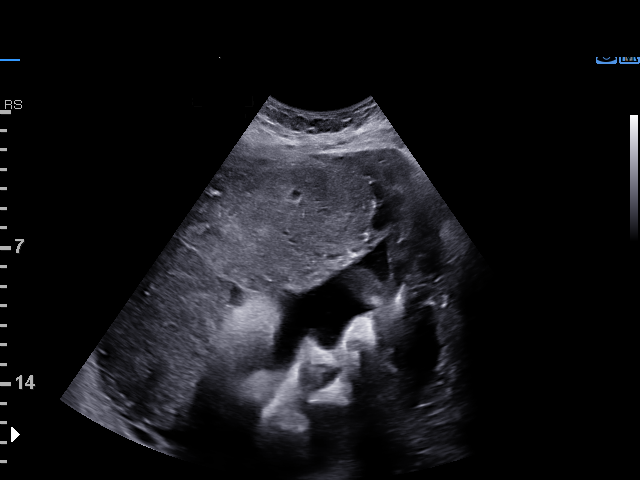
[im 22/24]
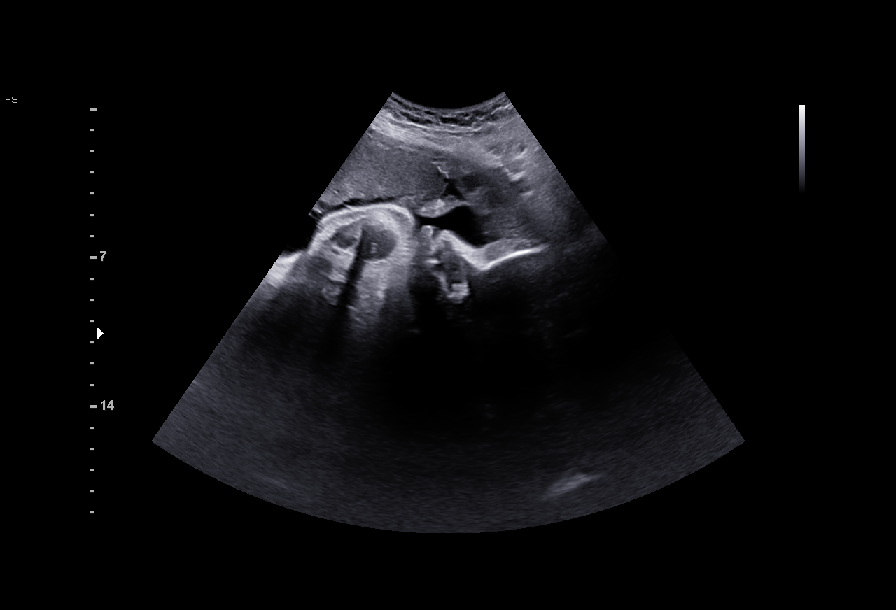
[im 24/24]
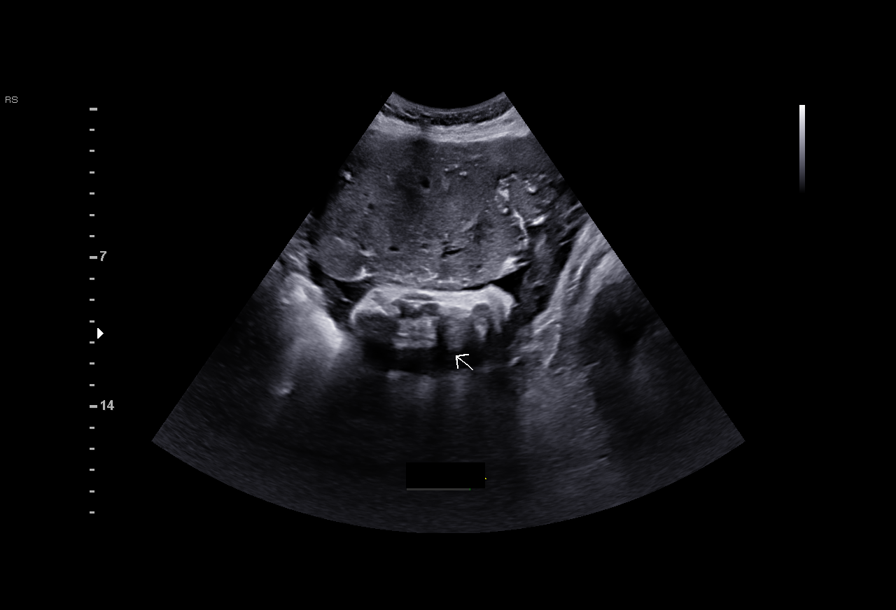

[13 of 24 positions shown; findings below may reference images not displayed]

1  ADLAHO          [PHONE_NUMBER]      [PHONE_NUMBER]     [PHONE_NUMBER]
Indications

39 weeks gestation of pregnancy
Fetal choroid plexus cyst - resolved           [P4]
Advanced maternal age multigravida 35+         [P4]
(41), third trimester (low risk NIPS)
OB History

Blood Type:            Height:  5'6"   Weight (lb):  186      BMI:
Gravidity:    10        Term:   3        Prem:   0        SAB:   5
TOP:          0       Ectopic:  0        Living: 3
Fetal Evaluation

Num Of Fetuses:     1
Fetal Heart         157
Rate(bpm):
Cardiac Activity:   Observed
Presentation:       Cephalic

Amniotic Fluid
AFI FV:      Subjectively within normal limits

AFI Sum(cm)     %Tile       Largest Pocket(cm)
15.56           66

RUQ(cm)       RLQ(cm)       LUQ(cm)        LLQ(cm)
4.45
Biophysical Evaluation

Amniotic F.V:   Pocket => 2 cm two         F. Tone:        Observed
planes
F. Movement:    Observed                   Score:          [DATE]
F. Breathing:   Observed
Gestational Age

LMP:           39w 6d       Date:   [DATE]                 EDD:   [DATE]
Best:          39w 6d    Det. By:   LMP  ([DATE])          EDD:   [DATE]
Impression

SIUP at 39+6 weeks
Cephalic presentation
Normal amniotic fluid volume
BPP [DATE]
Recommendations

Deliver planned for tomorrow

## 2017-01-29 ENCOUNTER — Inpatient Hospital Stay (HOSPITAL_COMMUNITY)
Admission: RE | Admit: 2017-01-29 | Discharge: 2017-01-31 | DRG: 775 | Disposition: A | Discharge status: Home / Self Care | Payer: Medicaid Other | Source: Ambulatory Visit | Attending: Family Medicine | Admitting: Family Medicine

## 2017-01-29 ENCOUNTER — Inpatient Hospital Stay (HOSPITAL_COMMUNITY): Payer: Medicaid Other | Admitting: Anesthesiology

## 2017-01-29 ENCOUNTER — Encounter (HOSPITAL_COMMUNITY): Payer: Self-pay

## 2017-01-29 DIAGNOSIS — Z3A40 40 weeks gestation of pregnancy: Secondary | ICD-10-CM

## 2017-01-29 DIAGNOSIS — O26893 Other specified pregnancy related conditions, third trimester: Secondary | ICD-10-CM | POA: Diagnosis present

## 2017-01-29 DIAGNOSIS — O9912 Other diseases of the blood and blood-forming organs and certain disorders involving the immune mechanism complicating childbirth: Secondary | ICD-10-CM | POA: Diagnosis present

## 2017-01-29 DIAGNOSIS — O320XX0 Maternal care for unstable lie, not applicable or unspecified: Secondary | ICD-10-CM | POA: Diagnosis present

## 2017-01-29 DIAGNOSIS — D6959 Other secondary thrombocytopenia: Secondary | ICD-10-CM | POA: Diagnosis present

## 2017-01-29 DIAGNOSIS — O358XX0 Maternal care for other (suspected) fetal abnormality and damage, not applicable or unspecified: Secondary | ICD-10-CM | POA: Diagnosis present

## 2017-01-29 DIAGNOSIS — D696 Thrombocytopenia, unspecified: Secondary | ICD-10-CM | POA: Diagnosis not present

## 2017-01-29 LAB — TYPE AND SCREEN
ABO/RH(D): A POS
Antibody Screen: NEGATIVE

## 2017-01-29 LAB — CBC
HCT: 33.1 % — ABNORMAL LOW (ref 36.0–46.0)
Hemoglobin: 10.8 g/dL — ABNORMAL LOW (ref 12.0–15.0)
MCH: 28.9 pg (ref 26.0–34.0)
MCHC: 32.6 g/dL (ref 30.0–36.0)
MCV: 88.5 fL (ref 78.0–100.0)
PLATELETS: 123 10*3/uL — AB (ref 150–400)
RBC: 3.74 MIL/uL — ABNORMAL LOW (ref 3.87–5.11)
RDW: 14.2 % (ref 11.5–15.5)
WBC: 6.1 10*3/uL (ref 4.0–10.5)

## 2017-01-29 MED ORDER — ONDANSETRON HCL 4 MG/2ML IJ SOLN: 4.0000 mg | INTRAMUSCULAR | Status: DC | PRN

## 2017-01-29 MED ORDER — OXYCODONE-ACETAMINOPHEN 5-325 MG PO TABS: 1.0000 | ORAL_TABLET | ORAL | Status: DC | PRN

## 2017-01-29 MED ORDER — SOD CITRATE-CITRIC ACID 500-334 MG/5ML PO SOLN: 30.0000 mL | ORAL | Status: DC | PRN

## 2017-01-29 MED ORDER — LACTATED RINGERS IV SOLN: 500.0000 mL | INTRAVENOUS | Status: DC | PRN

## 2017-01-29 MED ORDER — OXYTOCIN 40 UNITS IN LACTATED RINGERS INFUSION - SIMPLE MED
1.0000 m[IU]/min | INTRAVENOUS | Status: DC
  Administered 2017-01-29: 2 m[IU]/min via INTRAVENOUS
  Filled 2017-01-29: qty 1000

## 2017-01-29 MED ORDER — OXYCODONE-ACETAMINOPHEN 5-325 MG PO TABS: 2.0000 | ORAL_TABLET | ORAL | Status: DC | PRN

## 2017-01-29 MED ORDER — LIDOCAINE HCL (PF) 1 % IJ SOLN
INTRAMUSCULAR | Status: DC | PRN
  Administered 2017-01-29 (×2): 5 mL via EPIDURAL

## 2017-01-29 MED ORDER — LACTATED RINGERS IV SOLN
500.0000 mL | Freq: Once | INTRAVENOUS | Status: AC
  Administered 2017-01-29: 500 mL via INTRAVENOUS

## 2017-01-29 MED ORDER — SENNOSIDES-DOCUSATE SODIUM 8.6-50 MG PO TABS
2.0000 | ORAL_TABLET | ORAL | Status: DC
  Administered 2017-01-30 (×2): 2 via ORAL
  Filled 2017-01-29 (×2): qty 2

## 2017-01-29 MED ORDER — DIBUCAINE 1 % RE OINT
1.0000 | TOPICAL_OINTMENT | RECTAL | Status: DC | PRN
Start: 2017-01-29 — End: 2017-01-31

## 2017-01-29 MED ORDER — COCONUT OIL OIL
1.0000 "application " | TOPICAL_OIL | Status: DC | PRN
  Administered 2017-01-29: 1 via TOPICAL
  Filled 2017-01-29: qty 120

## 2017-01-29 MED ORDER — PRENATAL MULTIVITAMIN CH
1.0000 | ORAL_TABLET | Freq: Every day | ORAL | Status: DC
  Administered 2017-01-30 – 2017-01-31 (×2): 1 via ORAL
  Filled 2017-01-29 (×2): qty 1

## 2017-01-29 MED ORDER — PHENYLEPHRINE 40 MCG/ML (10ML) SYRINGE FOR IV PUSH (FOR BLOOD PRESSURE SUPPORT)
80.0000 ug | PREFILLED_SYRINGE | INTRAVENOUS | Status: DC | PRN
  Filled 2017-01-29: qty 5

## 2017-01-29 MED ORDER — LACTATED RINGERS IV SOLN
INTRAVENOUS | Status: DC
  Administered 2017-01-29 (×2): via INTRAVENOUS

## 2017-01-29 MED ORDER — EPHEDRINE 5 MG/ML INJ
10.0000 mg | INTRAVENOUS | Status: DC | PRN
  Filled 2017-01-29: qty 2

## 2017-01-29 MED ORDER — OXYTOCIN 40 UNITS IN LACTATED RINGERS INFUSION - SIMPLE MED: 2.5000 [IU]/h | INTRAVENOUS | Status: DC

## 2017-01-29 MED ORDER — DIPHENHYDRAMINE HCL 25 MG PO CAPS: 25.0000 mg | ORAL_CAPSULE | Freq: Four times a day (QID) | ORAL | Status: DC | PRN

## 2017-01-29 MED ORDER — TETANUS-DIPHTH-ACELL PERTUSSIS 5-2.5-18.5 LF-MCG/0.5 IM SUSP
0.5000 mL | Freq: Once | INTRAMUSCULAR | Status: AC
  Administered 2017-01-30: 0.5 mL via INTRAMUSCULAR
  Filled 2017-01-29: qty 0.5

## 2017-01-29 MED ORDER — WITCH HAZEL-GLYCERIN EX PADS
1.0000 "application " | MEDICATED_PAD | CUTANEOUS | Status: DC | PRN
  Administered 2017-01-29: 1 via TOPICAL

## 2017-01-29 MED ORDER — PHENYLEPHRINE 40 MCG/ML (10ML) SYRINGE FOR IV PUSH (FOR BLOOD PRESSURE SUPPORT): 80.0000 ug | PREFILLED_SYRINGE | INTRAVENOUS | Status: DC | PRN

## 2017-01-29 MED ORDER — PHENYLEPHRINE 40 MCG/ML (10ML) SYRINGE FOR IV PUSH (FOR BLOOD PRESSURE SUPPORT)
80.0000 ug | PREFILLED_SYRINGE | INTRAVENOUS | Status: DC | PRN
Start: 2017-01-29 — End: 2017-01-29
  Filled 2017-01-29: qty 5
  Filled 2017-01-29: qty 10

## 2017-01-29 MED ORDER — TERBUTALINE SULFATE 1 MG/ML IJ SOLN
0.2500 mg | Freq: Once | INTRAMUSCULAR | Status: DC | PRN
  Filled 2017-01-29: qty 1

## 2017-01-29 MED ORDER — DIPHENHYDRAMINE HCL 50 MG/ML IJ SOLN: 12.5000 mg | INTRAMUSCULAR | Status: DC | PRN

## 2017-01-29 MED ORDER — ONDANSETRON HCL 4 MG/2ML IJ SOLN: 4.0000 mg | Freq: Four times a day (QID) | INTRAMUSCULAR | Status: DC | PRN

## 2017-01-29 MED ORDER — LIDOCAINE HCL (PF) 1 % IJ SOLN
30.0000 mL | INTRAMUSCULAR | Status: DC | PRN
  Filled 2017-01-29: qty 30

## 2017-01-29 MED ORDER — ACETAMINOPHEN 325 MG PO TABS: 650.0000 mg | ORAL_TABLET | ORAL | Status: DC | PRN

## 2017-01-29 MED ORDER — LACTATED RINGERS IV SOLN: 500.0000 mL | Freq: Once | INTRAVENOUS | Status: DC

## 2017-01-29 MED ORDER — MISOPROSTOL 25 MCG QUARTER TABLET
25.0000 ug | ORAL_TABLET | ORAL | Status: DC | PRN
  Administered 2017-01-29: 25 ug via VAGINAL
  Filled 2017-01-29 (×3): qty 1

## 2017-01-29 MED ORDER — IBUPROFEN 600 MG PO TABS
600.0000 mg | ORAL_TABLET | Freq: Four times a day (QID) | ORAL | Status: DC
  Administered 2017-01-29 – 2017-01-31 (×8): 600 mg via ORAL
  Filled 2017-01-29 (×8): qty 1

## 2017-01-29 MED ORDER — MISOPROSTOL 50MCG HALF TABLET: 50.0000 ug | ORAL_TABLET | ORAL | Status: DC | PRN

## 2017-01-29 MED ORDER — FENTANYL 2.5 MCG/ML BUPIVACAINE 1/10 % EPIDURAL INFUSION (WH - ANES)
14.0000 mL/h | INTRAMUSCULAR | Status: DC | PRN
  Administered 2017-01-29: 14 mL/h via EPIDURAL
  Filled 2017-01-29: qty 100

## 2017-01-29 MED ORDER — EPHEDRINE 5 MG/ML INJ: 10.0000 mg | INTRAVENOUS | Status: DC | PRN

## 2017-01-29 MED ORDER — OXYTOCIN BOLUS FROM INFUSION
500.0000 mL | Freq: Once | INTRAVENOUS | Status: AC
  Administered 2017-01-29: 500 mL via INTRAVENOUS

## 2017-01-29 MED ORDER — ONDANSETRON HCL 4 MG PO TABS: 4.0000 mg | ORAL_TABLET | ORAL | Status: DC | PRN

## 2017-01-29 MED ORDER — ZOLPIDEM TARTRATE 5 MG PO TABS: 5.0000 mg | ORAL_TABLET | Freq: Every evening | ORAL | Status: DC | PRN

## 2017-01-29 MED ORDER — SIMETHICONE 80 MG PO CHEW: 80.0000 mg | CHEWABLE_TABLET | ORAL | Status: DC | PRN

## 2017-01-29 MED ORDER — BENZOCAINE-MENTHOL 20-0.5 % EX AERO: 1.0000 "application " | INHALATION_SPRAY | CUTANEOUS | Status: DC | PRN

## 2017-01-29 MED ORDER — FENTANYL CITRATE (PF) 100 MCG/2ML IJ SOLN: 50.0000 ug | INTRAMUSCULAR | Status: DC | PRN

## 2017-01-29 NOTE — Progress Notes (Signed)
LABOR PROGRESS NOTE  Shelley Hardy is a 41 y.o. [id number] at [redacted]w[redacted]d  admitted for iol for [place].  Subjective: Mild pain  Objective: BP 118/64   Temp 97.8 F (36.6 C) (Oral)   Ht 5\' 6"  (1.676 m)   Wt 186 lb (84.4 kg)   LMP 04/24/2016 (Exact Date)   BMI 30.02 kg/m  or  Vitals:   01/29/17 0715 01/29/17 0726 01/29/17 1000  BP: 118/64    Temp: 98.2 F (36.8 C)  97.8 F (36.6 C)  TempSrc: Oral  Oral  Weight:  186 lb (84.4 kg)   Height:  5\' 6"  (1.676 m)     140/mod/+a/-d Dilation: 3 Effacement (%): 50 Cervical Position: Posterior Station: -3 Presentation: Vertex Exam by:: Dr Si Raider  Labs: Lab Results  Component Value Date   WBC 6.1 01/29/2017   HGB 10.8 (L) 01/29/2017   HCT 33.1 (L) 01/29/2017   MCV 88.5 01/29/2017   PLT 123 (L) 01/29/2017    Patient Active Problem List   Diagnosis Date Noted  . Gestational thrombocytopenia ([place]) 01/25/2017  . Unstable lie of fetus 01/25/2017  . AMA (advanced maternal age) multigravida 35+ 07/05/2016  . Supervision of elderly multigravida (>=56 years old at time of delivery) 07/05/2016  . Aborter, habitual, antepartum 07/05/2016  . Hypertriglyceridemia 11/15/2015  . H/O: pituitary tumor 11/17/2014  . Depression 11/17/2014  . Prolactinoma ([place]) 08/07/2013  . Goiter 08/07/2013    Assessment / Plan: 41 y.o. [id number] at [redacted]w[redacted]d here for iol for ama  Labor: cervix now ripened s/p cytotec x1, will start pitocin Fetal Wellbeing:  Cat 1 Pain Control:  Non-pharm for now, eventual epidural Anticipated MOD:  vaginal  Desma Maxim, MD 01/29/2017, 12:19 PM

## 2017-01-29 NOTE — H&P (Signed)
LABOR AND DELIVERY ADMISSION HISTORY AND PHYSICAL NOTE  Shelley Hardy is a 41 y.o. female [phone] with IUP at [redacted]w[redacted]d by L/14 presenting for iol.   She reports positive fetal movement. She denies leakage of fluid or vaginal bleeding.  Prenatal History/Complications:  Past Medical History: Past Medical History:  Diagnosis Date  . AMA (advanced maternal age) multigravida 65+   . Pituitary tumor     Past Surgical History: Past Surgical History:  Procedure Laterality Date  . NO PAST SURGERIES      Obstetrical History: OB History    Gravida Para Term Preterm AB Living   8 3 3  0 4 3   SAB TAB Ectopic Multiple Live Births   4 0 0 0 3      Social History: Social History   Social History  . Marital status: Married    Spouse name: N/A  . Number of children: 3   . Years of education: N/A   Occupational History  .  [employer]   Social History Main Topics  . Smoking status: Never Smoker  . Smokeless tobacco: Never Used  . Alcohol use No  . Drug use: No  . Sexual activity: Yes    Birth control/ protection: None     Comment: Last intercourse 5 days ago   Other Topics Concern  . None   Social History Narrative   Patient is married and lives at home with her husband.   Education associates degree.   Right handed.   Caffeine none     Family History: Family History  Problem Relation Age of Onset  . Diabetes Maternal Grandmother   . Thyroid disease Maternal Aunt   . Glucose-6-phos deficiency Son     Allergies: No Known Allergies  Prescriptions Prior to Admission  Medication Sig Dispense Refill Last Dose  . famotidine (PEPCID) 20 MG tablet Take 20 mg by mouth 2 (two) times daily as needed for heartburn or indigestion.   01/29/2017 at Unknown time  . Prenatal Vit-DSS-Fe Fum-FA (SE-NATAL 19) 29-1 MG TABS TAKE 1 TABLET BY MOUTH DAILY 30 tablet 0 01/28/2017 at Unknown time  . butalbital-acetaminophen-caffeine (FIORICET, ESGIC) 50-325-40 MG tablet Take 1-2  tablets by mouth every 6 (six) hours as needed for headache. 20 tablet 0 01/19/2017  . hydrocortisone (ANUSOL-HC) 25 MG suppository Place 1 suppository (25 mg total) rectally 2 (two) times daily. 12 suppository 1 01/26/2017  . ondansetron (ZOFRAN-ODT) 4 MG disintegrating tablet DISSOLVE ONE TABLET BY MOUTH EVERY 6 HOURS AS NEEDED FOR NAUSEA (Patient not taking: Reported on 01/29/2017) 20 tablet 0 Not Taking at Unknown time  . promethazine (PHENERGAN) 25 MG tablet Take 1 tablet (25 mg total) by mouth every 6 (six) hours as needed for nausea or vomiting. 20 tablet 2 01/22/2017     Review of Systems   All systems reviewed and negative except as stated in HPI  Blood pressure 118/64, temperature 98.2 F (36.8 C), temperature source Oral, height 5\' 6"  (1.676 m), weight 186 lb (84.4 kg), last menstrual period 04/24/2016, unknown if currently breastfeeding. General appearance: alert, cooperative and appears stated age Lungs: clear to auscultation bilaterally Heart: regular rate and rhythm Abdomen: soft, non-tender; bowel sounds normal Extremities: No calf swelling or tenderness Presentation: cephalic Fetal monitoring: 140/mod/-a/-d Uterine activity: quiet Dilation: Closed Effacement (%): Thick Station: Ballotable Exam by:: Hubbard Hartshorn RN   Prenatal labs: ABO, Rh: A/Positive/-- (12/21 1005) Antibody: Negative (12/21 1005) Rubella: imm RPR: Non Reactive (05/25 1050)  HBsAg: Negative (12/21 1005)  HIV: Non Reactive (05/25 1050)  GBS: Negative (06/21 1043)  1 hr Glucola: wnl   Genetic screening:  First wnl Anatomy US: wnl  Prenatal Transfer Tool  Maternal Diabetes: No Genetic Screening: Normal Maternal Ultrasounds/Referrals: Normal Fetal Ultrasounds or other Referrals:  None Maternal Substance Abuse:  No Significant Maternal Medications:  None Significant Maternal Lab Results: Lab values include: Group B Strep negative  Results for orders placed or performed during the hospital encounter of  01/29/17 (from the past 24 hour(s))  CBC   Collection Time: 01/29/17  7:23 AM  Result Value Ref Range   WBC 6.1 4.0 - 10.5 K/uL   RBC 3.74 (L) 3.87 - 5.11 MIL/uL   Hemoglobin 10.8 (L) 12.0 - 15.0 g/dL   HCT 33.1 (L) 36.0 - 46.0 %   MCV 88.5 78.0 - 100.0 fL   MCH 28.9 26.0 - 34.0 pg   MCHC 32.6 30.0 - 36.0 g/dL   RDW 14.2 11.5 - 15.5 %   Platelets 123 (L) 150 - 400 K/uL    Patient Active Problem List   Diagnosis Date Noted  . Gestational thrombocytopenia ([place]) 01/25/2017  . Unstable lie of fetus 01/25/2017  . AMA (advanced maternal age) multigravida 35+ 07/05/2016  . Supervision of elderly multigravida (>=80 years old at time of delivery) 07/05/2016  . Aborter, habitual, antepartum 07/05/2016  . Hypertriglyceridemia 11/15/2015  . H/O: pituitary tumor 11/17/2014  . Depression 11/17/2014  . Prolactinoma ([place]) 08/07/2013  . Goiter 08/07/2013    Assessment: Shelley Hardy is a 41 y.o. [id number] at [redacted]w[redacted]d here for iol for ama  #AMA: inducing today at 73 w #g thrombocytopenia: plts 123 on admit. No s/s preeclampsia/hellp #prolactinoma: asymptomatic. Will f/u with endo postpartum #Labor: cytotec placed, will attempt foley with next cytotec placement #Pain: Non-pharmacologic for now #FWB:  cat 1 #ID:  gbs neg #MOF: breast and bottle #MOC: undecided #Circ:  n/a  Desma Maxim 01/29/2017, 9:08 AM

## 2017-01-29 NOTE — Anesthesia Procedure Notes (Signed)
Epidural Patient location during procedure: OB Start time: 01/29/2017 1:51 PM End time: 01/29/2017 2:02 PM  Staffing Anesthesiologist: Marcie Bal, Jaren Kearn Performed: anesthesiologist   Preanesthetic Checklist Completed: patient identified, site marked, pre-op evaluation, timeout performed, IV checked, risks and benefits discussed and monitors and equipment checked  Epidural Patient position: sitting Prep: DuraPrep Patient monitoring: heart rate, cardiac monitor, continuous pulse ox and blood pressure Approach: midline Location: L2-L3 Injection technique: LOR saline  Needle:  Needle type: Tuohy  Needle gauge: 17 G Needle length: 9 cm Needle insertion depth: 5 cm Catheter type: closed end flexible Catheter size: 19 Gauge Catheter at skin depth: 11 cm Test dose: negative and Other  Assessment Events: blood not aspirated, injection not painful, no injection resistance and negative IV test  Additional Notes Informed consent obtained prior to proceeding including risk of failure, 1% risk of PDPH, risk of minor discomfort and bruising.  Discussed rare but serious complications including epidural abscess, permanent nerve injury, epidural hematoma.  Discussed alternatives to epidural analgesia and patient desires to proceed.  Timeout performed pre-procedure verifying patient name, procedure, and platelet count.  Patient tolerated procedure well. Reason for block:procedure for pain

## 2017-01-29 NOTE — Anesthesia Pain Management Evaluation Note (Signed)
  CRNA Pain Management Visit Note  Patient: Shelley Hardy, 40 y.o., female  "Hello I am a member of the anesthesia team at [facility]. We have an anesthesia team available at all times to provide care throughout the hospital, including epidural management and anesthesia for C-section. I don't know your plan for the delivery whether it a natural birth, water birth, IV sedation, nitrous supplementation, doula or epidural, but we want to meet your pain goals."   1.Was your pain managed to your expectations on prior hospitalizations?   No   2.What is your expectation for pain management during this hospitalization?     Epidural  3.How can we help you reach that goal? Epidural when desired  Record the patient's initial score and the patient's pain goal.   Pain: 1  Pain Goal: 5 [facility] wants you to be able to say your pain was always managed very well.  Shelley Hardy 01/29/2017

## 2017-01-29 NOTE — Anesthesia Preprocedure Evaluation (Signed)
Anesthesia Evaluation  Patient identified by MRN, date of birth, ID band Patient awake    Reviewed: Allergy & Precautions, H&P , NPO status , Patient's Chart, lab work & pertinent test results  Airway Mallampati: II   Neck ROM: full    Dental   Pulmonary neg pulmonary ROS,    breath sounds clear to auscultation       Cardiovascular negative cardio ROS   Rhythm:regular Rate:Normal     Neuro/Psych PSYCHIATRIC DISORDERS Depression    GI/Hepatic   Endo/Other    Renal/GU      Musculoskeletal   Abdominal   Peds  Hematology   Anesthesia Other Findings   Reproductive/Obstetrics (+) Pregnancy                             Anesthesia Physical Anesthesia Plan  ASA: II  Anesthesia Plan: Epidural   Post-op Pain Management:    Induction: Intravenous  PONV Risk Score and Plan: 2 and Treatment may vary due to age or medical condition  Airway Management Planned: Natural Airway  Additional Equipment:   Intra-op Plan:   Post-operative Plan:   Informed Consent: I have reviewed the patients History and Physical, chart, labs and discussed the procedure including the risks, benefits and alternatives for the proposed anesthesia with the patient or authorized representative who has indicated his/her understanding and acceptance.     Plan Discussed with: Anesthesiologist  Anesthesia Plan Comments:         Anesthesia Quick Evaluation

## 2017-01-30 LAB — RPR: RPR Ser Ql: NONREACTIVE

## 2017-01-30 NOTE — Progress Notes (Signed)
MOB was referred for history of depression/anxiety. * Referral screened out by Clinical Social Worker because none of the following criteria appear to apply: ~ History of anxiety/depression during this pregnancy, or of post-partum depression. ~ Diagnosis of anxiety and/or depression within last 3 years OR * MOB's symptoms currently being treated with medication and/or therapy. Please contact the Clinical Social Worker if needs arise, or if MOB requests.

## 2017-01-30 NOTE — Anesthesia Postprocedure Evaluation (Signed)
Anesthesia Post Note  Patient: Shelley Hardy  Procedure(s) Performed: * No procedures listed *     Patient location during evaluation: Mother Baby Anesthesia Type: Epidural Level of consciousness: awake Pain management: satisfactory to patient Vital Signs Assessment: post-procedure vital signs reviewed and stable Respiratory status: spontaneous breathing Cardiovascular status: stable Anesthetic complications: no    Last Vitals:  Vitals:   01/30/17 0524 01/30/17 0600  BP: (!) 91/48 (!) 104/57  Pulse: 83   Resp:    Temp: 36.6 C     Last Pain:  Vitals:   01/30/17 0524  TempSrc: Oral  PainSc:    Pain Goal:                 [employer]

## 2017-01-30 NOTE — Lactation Note (Signed)
This note was copied from a baby's chart. Lactation Consultation Note  Patient Name: Shelley Hardy [id number] Date: 01/30/2017 Reason for consult: Initial assessment  Baby 49 hours old. 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 Mom states that she has lots of milk, more with each baby. Since the, she has nursed her 3 older children for 2 years each. Mom states this baby nursing well and she denies needing any assistance at this time. Mom given [facility] brochure with review, and is aware of OP/BFSG and [place] phone line assistance after D/C. Mom requested a manual pump and it was given along with disposable breast pads.   Maternal Data Has patient been taught Hand Expression?: Yes Does the patient have breastfeeding experience prior to this delivery?: Yes  Feeding Feeding Type: Breast Fed Length of feed: 15 min  LATCH Score/Interventions Latch: Grasps breast easily, tongue down, lips flanged, rhythmical sucking. Intervention(s): Adjust position;Assist with latch  Audible Swallowing: A few with stimulation Intervention(s): Skin to skin;Hand expression  Type of Nipple: Everted at rest and after stimulation  Comfort (Breast/Nipple): Soft / non-tender  Interventions (Mild/moderate discomfort): Hand massage;Hand expression  Hold (Positioning): Full assist, staff holds infant at breast  LATCH Score: 7  Lactation Tools Discussed/Used Tools: Pump Breast pump type: Manual   Consult Status Consult Status: PRN    Andres Labrum 01/30/2017, 2:30 PM

## 2017-01-30 NOTE — Discharge Instructions (Signed)

## 2017-01-30 NOTE — Progress Notes (Signed)
POSTPARTUM PROGRESS NOTE  Post Partum Day 1  Subjective:  Shelley Hardy is a 41 y.o. [id number] [redacted]w[redacted]d s/p SVD IOL for AMA.  No acute events overnight.  Pt denies problems with ambulating, voiding or po intake.  She denies nausea or vomiting.  Pain is well controlled.  She has had flatus. She has had bowel movement.  Lochia Minimal.   Objective: Blood pressure (!) 104/57, pulse 83, temperature 97.8 F (36.6 C), temperature source Oral, resp. rate 18, height 5\' 6"  (1.676 m), weight 84.4 kg (186 lb), last menstrual period 04/24/2016, SpO2 100 %, unknown if currently breastfeeding.  Physical Exam:  General: Alert, cooperative and no distress Lochia: Normal flow,  Uterine Fundus: Firm DVT Evaluation: No calf swelling or tenderness Extremities: No edema   Recent Labs  01/29/17 0723  HGB 10.8*  HCT 33.1*    Assessment/Plan:  ASSESSMENT: Shelley Hardy is a 41 y.o. [id number] [redacted]w[redacted]d s/p SVD, IOL for AMA  Plan for discharge tomorrow  Contraception: Will follow up with Endocrine for asymptomatic prolactimona   LOS: 1 day   Salome Arnt, Medical Student   CNM attestation Post Partum Day #1 I have seen and examined this patient and agree with above documentation in the medical student's note.   Shelley Hardy is a 41 y.o. [id number] s/p SVD.  Pt denies problems with ambulating, voiding or po intake. Pain is well controlled.  Plan for birth control is unsure, probably will decline.  Method of Feeding: both  PE:  BP (!) 99/53 (BP Location: Left Arm)   Pulse 78   Temp 97.6 F (36.4 C) (Axillary)   Resp 19   Ht 5\' 6"  (1.676 m)   Wt 84.4 kg (186 lb)   LMP 04/24/2016 (Exact Date)   SpO2 100%   Breastfeeding? Unknown   BMI 30.02 kg/m  Fundus firm  Plan for discharge: 01/31/17  Serita Grammes, CNM 11:15 AM 01/30/2017

## 2017-01-31 MED ORDER — IBUPROFEN 600 MG PO TABS: 600.0000 mg | ORAL_TABLET | Freq: Four times a day (QID) | ORAL | 0 refills | Status: DC

## 2017-01-31 NOTE — Lactation Note (Signed)
This note was copied from a baby's chart. Lactation Consultation Note  Patient Name: Shelley Hardy Date: 01/31/2017 Reason for consult: Follow-up assessment;Infant weight loss;Breast/nipple pain (5% weight loss / left sore nipple )  Baby is 42 hours old and presently sleeping after a recent feeding per mom.  LC reviewed doc flow sheets/ WNL for D/C and updated.  LC reviewed basics and assessed  breast tissue with moms permission due to sore nipples.  Right nipple sore, LC noted  areola edema, and showed mom how to due the reverse pressure.  Also easily hand expressed. LC reminded  mom to use it on her nipples liberally. LC recommended  using it 1st over the coconut oil.  Sore nipple and engorgement prevention and tx reviewed.  LC checked manual pump and flange size , #24 Flange good for today, #27 given for when milk comes  in with instructions.  Per mom has been using the cradle position and noted the baby not latching as close, so switched to the  Side lying and it was better.  LC also recommended if her 1st breast is really full to hand express off the fullness  10-15 ml . Latch and  Have baby feed 1st breast to softness and offer the 2nd breast , if the baby only feeds 1st breast to release  2nd breast down hand express or hand pump or both .  Mother informed of post-discharge support and given phone number to the lactation department, including services for phone call assistance; out-patient appointments; and breastfeeding support group. List of other breastfeeding resources in the community given in the handout. Encouraged mother to call for problems or concerns related to breastfeeding.    Maternal Data Has patient been taught Hand Expression?: Yes  Feeding Feeding Type:  (baby recentky breast fed for 20 mins ) Length of feed: 20 min (per mom )  LATCH Score/Interventions Latch: Grasps breast easily, tongue down, lips flanged, rhythmical sucking.  Audible  Swallowing: A few with stimulation  Type of Nipple: Everted at rest and after stimulation  Comfort (Breast/Nipple): Soft / non-tender     Hold (Positioning): No assistance needed to correctly position infant at breast.  LATCH Score: 9  Lactation Tools Discussed/Used Tools: Pump;Flanges;Other (comment) (comfort gels,) Flange Size: 27 (#24 flange a good fit for today, and #27 provided for when milk comes in ) Breast pump type: Manual WIC Program: Yes (per mom ) Pump Review: Setup, frequency, and cleaning Initiated by:: reviewed / MAI  Date initiated:: 01/31/17   Consult Status Consult Status: Complete Date: 01/31/17    Jerlyn Ly Jillyn Stacey 01/31/2017, 11:23 AM

## 2017-01-31 NOTE — Discharge Summary (Signed)
OB Discharge Summary     Patient Name: Shelley Hardy DOB: [birth year] MRN: [id number]  Date of admission: 01/29/2017 Delivering MD: Laurey Arrow [place]   Date of discharge: 01/31/2017  Admitting diagnosis: 40wks induction  Intrauterine pregnancy: [redacted]w[redacted]d     Secondary diagnosis:  Active Problems:   Unstable lie of fetus  Additional problems: AMA; asymptomatic prolactinoma; gest thrombocytopenia     Discharge diagnosis: Term Pregnancy Delivered                                                                                                Post partum procedures:none  Augmentation: Pitocin and Cytotec  Complications: None  Hospital course:  Induction of Labor With Vaginal Delivery   41 y.o. yo [id number] at [redacted]w[redacted]d was admitted to the hospital 01/29/2017 for induction of labor.  Indication for induction: AMA. Her plts on admission were 123. She was not currently taking bromocriptine. Patient had an uncomplicated labor course as follows: Membrane Rupture Time/Date: 2:16 PM ,01/29/2017   Intrapartum Procedures: Episiotomy: None [1]                                         Lacerations:  None [1]  Patient had delivery of a Viable infant.  Information for the patient's newborn:  Shelley Hardy [[id number]]  Delivery Method: Vaginal, Spontaneous Delivery (Filed from Delivery Summary)   01/29/2017  Details of delivery can be found in separate delivery note.  Patient had a routine postpartum course. Patient is discharged home 01/31/17.  Physical exam  Vitals:   01/30/17 0600 01/30/17 0800 01/30/17 1908 01/31/17 0632  BP: (!) 104/57 (!) 99/53 (!) 113/57 (!) 107/57  Pulse:  78 70 77  Resp:  19 18 18   Temp:  97.6 F (36.4 C) 97.8 F (36.6 C) 98 F (36.7 C)  TempSrc:  Axillary Oral Oral  SpO2:      Weight:      Height:       General: alert and cooperative Lochia: appropriate Uterine Fundus: firm Incision: N/A DVT Evaluation: No evidence of DVT seen on physical  exam. Labs: Lab Results  Component Value Date   WBC 6.1 01/29/2017   HGB 10.8 (L) 01/29/2017   HCT 33.1 (L) 01/29/2017   MCV 88.5 01/29/2017   PLT 123 (L) 01/29/2017   CMP Latest Ref Rng & Units 11/17/2015  Glucose 65 - 99 mg/dL 90  BUN 7 - 25 mg/dL 8  Creatinine 0.50 - 1.10 mg/dL 0.50  Sodium 135 - 146 mmol/L 136  Potassium 3.5 - 5.3 mmol/L 4.6  Chloride 98 - 110 mmol/L 103  CO2 20 - 31 mmol/L 25  Calcium 8.6 - 10.2 mg/dL 9.0  Total Protein 6.1 - 8.1 g/dL 6.4  Total Bilirubin 0.2 - 1.2 mg/dL 0.5  Alkaline Phos 33 - 115 U/L 36  AST 10 - 30 U/L 13  ALT 6 - 29 U/L 12    Discharge instruction: per After Visit Summary and "Baby and Me Booklet".  After  visit meds:  Allergies as of 01/31/2017   No Known Allergies     Medication List    STOP taking these medications   ondansetron 4 MG disintegrating tablet Commonly known as:  ZOFRAN-ODT     TAKE these medications   butalbital-acetaminophen-caffeine 50-325-40 MG tablet Commonly known as:  FIORICET, ESGIC Take 1-2 tablets by mouth every 6 (six) hours as needed for headache.   famotidine 20 MG tablet Commonly known as:  PEPCID Take 20 mg by mouth 2 (two) times daily as needed for heartburn or indigestion.   hydrocortisone 25 MG suppository Commonly known as:  ANUSOL-HC Place 1 suppository (25 mg total) rectally 2 (two) times daily.   ibuprofen 600 MG tablet Commonly known as:  ADVIL,MOTRIN Take 1 tablet (600 mg total) by mouth every 6 (six) hours.   promethazine 25 MG tablet Commonly known as:  PHENERGAN Take 1 tablet (25 mg total) by mouth every 6 (six) hours as needed for nausea or vomiting.   SE-NATAL 19 29-1 MG Tabs TAKE 1 TABLET BY MOUTH DAILY       Diet: routine diet  Activity: Advance as tolerated. Pelvic rest for 6 weeks.   Outpatient follow up:4 weeks; will make appt for endocrine f/u as well Follow up Appt:No future appointments. Follow up Visit:No Follow-up on file.  Postpartum contraception:  Undecided  Newborn Data: Live born female  Birth Weight: 8 lb 8.2 oz (3861 g) APGAR: 9, 9  Baby Feeding: Bottle and Breast Disposition:home with mother   01/31/2017 Serita Grammes, CNM  8:52 AM

## 2017-02-01 ENCOUNTER — Telehealth: Payer: Self-pay | Admitting: Obstetrics

## 2017-02-28 ENCOUNTER — Ambulatory Visit: Payer: Medicaid Other | Admitting: Certified Nurse Midwife

## 2017-03-03 ENCOUNTER — Other Ambulatory Visit: Payer: Self-pay | Admitting: Endocrinology

## 2017-03-03 DIAGNOSIS — D352 Benign neoplasm of pituitary gland: Secondary | ICD-10-CM

## 2017-03-04 ENCOUNTER — Ambulatory Visit: Payer: Medicaid Other | Admitting: Endocrinology

## 2017-03-04 ENCOUNTER — Other Ambulatory Visit: Payer: Self-pay

## 2017-03-06 ENCOUNTER — Other Ambulatory Visit: Payer: Medicaid Other

## 2017-03-06 DIAGNOSIS — D352 Benign neoplasm of pituitary gland: Secondary | ICD-10-CM

## 2017-03-07 LAB — PROLACTIN: PROLACTIN: 223.3 ng/mL — AB (ref 4.8–23.3)

## 2017-03-11 ENCOUNTER — Other Ambulatory Visit: Payer: Self-pay | Admitting: Obstetrics

## 2017-03-11 ENCOUNTER — Ambulatory Visit: Payer: Self-pay | Admitting: Obstetrics

## 2017-03-11 ENCOUNTER — Other Ambulatory Visit: Payer: Self-pay | Admitting: Obstetrics and Gynecology

## 2017-03-11 DIAGNOSIS — R110 Nausea: Secondary | ICD-10-CM

## 2017-10-24 ENCOUNTER — Encounter: Payer: Self-pay | Admitting: Endocrinology

## 2017-10-30 ENCOUNTER — Telehealth: Payer: Self-pay | Admitting: Endocrinology

## 2017-10-30 NOTE — Telephone Encounter (Signed)
Patient dismissed from [facility] by Elayne Snare MD , effective October 24, 2017. Dismissal letter sent out by certified / registered mail.  daj

## 2017-11-05 NOTE — Telephone Encounter (Signed)
Received signed domestic return receipt verifying delivery of certified letter on October 31, 2017. Article number [phone] [id number]

## 2018-02-23 ENCOUNTER — Emergency Department (HOSPITAL_COMMUNITY)
Admission: EM | Admit: 2018-02-23 | Discharge: 2018-02-24 | Disposition: A | Discharge status: Home / Self Care | Payer: No Typology Code available for payment source | Attending: Emergency Medicine | Admitting: Emergency Medicine

## 2018-02-23 DIAGNOSIS — Z79899 Other long term (current) drug therapy: Secondary | ICD-10-CM | POA: Diagnosis not present

## 2018-02-23 DIAGNOSIS — Y939 Activity, unspecified: Secondary | ICD-10-CM | POA: Diagnosis not present

## 2018-02-23 DIAGNOSIS — Y999 Unspecified external cause status: Secondary | ICD-10-CM | POA: Diagnosis not present

## 2018-02-23 DIAGNOSIS — Y92411 Interstate highway as the place of occurrence of the external cause: Secondary | ICD-10-CM | POA: Diagnosis not present

## 2018-02-23 DIAGNOSIS — M542 Cervicalgia: Secondary | ICD-10-CM | POA: Diagnosis not present

## 2018-02-23 DIAGNOSIS — R51 Headache: Secondary | ICD-10-CM | POA: Insufficient documentation

## 2018-02-23 NOTE — ED Notes (Signed)
Pt with her children in Peds. Pt states she may come back to be seen once her children have been seen.

## 2018-02-24 ENCOUNTER — Encounter (HOSPITAL_COMMUNITY): Payer: Self-pay | Admitting: *Deleted

## 2018-02-24 ENCOUNTER — Emergency Department (HOSPITAL_COMMUNITY): Payer: No Typology Code available for payment source

## 2018-02-24 MED ORDER — ACETAMINOPHEN 500 MG PO TABS
1000.0000 mg | ORAL_TABLET | Freq: Once | ORAL | Status: AC
  Administered 2018-02-24: 1000 mg via ORAL
  Filled 2018-02-24: qty 2
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
## 2018-02-24 NOTE — Discharge Instructions (Signed)
The pain your experiencing is likely due to muscle strain, you may take tylenol as needed for pain, this is safe in breast-feeding.  You may also use ice and heat.  The muscle soreness should improve over the next week. Follow up with your family doctor in the next week for a recheck if you are still having symptoms. Return to ED if pain is worsening, you develop weakness or numbness of extremities, or new or concerning symptoms develop.

## 2018-03-20 ENCOUNTER — Ambulatory Visit (HOSPITAL_COMMUNITY)
Admission: EM | Admit: 2018-03-20 | Discharge: 2018-03-20 | Disposition: A | Discharge status: Home / Self Care | Payer: Medicaid Other | Attending: Family Medicine | Admitting: Family Medicine

## 2018-03-20 ENCOUNTER — Encounter (HOSPITAL_COMMUNITY): Payer: Self-pay | Admitting: Emergency Medicine

## 2018-03-20 DIAGNOSIS — M545 Low back pain, unspecified: Secondary | ICD-10-CM

## 2018-03-20 MED ORDER — NAPROXEN 500 MG PO TABS: 500.0000 mg | ORAL_TABLET | Freq: Two times a day (BID) | ORAL | 0 refills | Status: AC

## 2018-03-20 MED ORDER — CYCLOBENZAPRINE HCL 5 MG PO TABS: 5.0000 mg | ORAL_TABLET | Freq: Every day | ORAL | 0 refills | Status: DC

## 2018-03-20 NOTE — Discharge Instructions (Signed)
Light and regular activity as tolerated.  Avoid heavy lifting or bending.  Naproxen twice a day, take with food. Stop ibuprofen.  Muscle relaxer as needed as previously prescribed.  Please follow up with your primary care provider as needed for persistent symptoms.

## 2018-03-20 NOTE — ED Provider Notes (Signed)
[facility]    CSN: [id number] Arrival date & time: 03/20/18  1207     History   Chief Complaint Chief Complaint  Patient presents with  . Back Pain    HPI Shelley Hardy is a 42 y.o. female.   [name] presents with complaints of right sided low back pain which has worsened over the past two days. Has had before but typically ibuprofen helps. Pain worse with movements. Took ibuprofen and muscle relaxer yesterday which did not help. Has not taken anything for pain today. No known trigger or exacerbating event prior to onset of symptoms. No numbness, tingling or weakness to lower legs. Ambulatory without difficulty. Pain to right buttock. No saddle paresthesia, no urinary or stool incontinence. Denies any previous similar. Denies urinary symptoms, denies abdominal pain. Hx of pituitary tumor, depression, goiter.    ROS per HPI.      Past Medical History:  Diagnosis Date  . AMA (advanced maternal age) multigravida 30+   . Pituitary tumor     Patient Active Problem List   Diagnosis Date Noted  . Gestational thrombocytopenia ([place]) 01/25/2017  . Unstable lie of fetus 01/25/2017  . AMA (advanced maternal age) multigravida 35+ 07/05/2016  . Supervision of elderly multigravida (>=42 years old at time of delivery) 07/05/2016  . Aborter, habitual, antepartum 07/05/2016  . Hypertriglyceridemia 11/15/2015  . H/O: pituitary tumor 11/17/2014  . Depression 11/17/2014  . 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 Goiter 08/07/2013    Past Surgical History:  Procedure Laterality Date  . NO PAST SURGERIES      OB History    Gravida  8   Para  4   Term  4   Preterm  0   AB  4   Living  4     SAB  4   TAB  0   Ectopic  0   Multiple  0   Live Births  4            Home Medications    Prior to Admission medications   Medication Sig Start Date End Date Taking? Authorizing Provider  famotidine (PEPCID) 20 MG tablet Take 20 mg by mouth 2 (two) times  daily as needed for heartburn or indigestion.    [provider]  hydrocortisone (ANUSOL-HC) 25 MG suppository Place 1 suppository (25 mg total) rectally 2 (two) times daily. 12/20/16   Woodroe Mode, MD  naproxen (NAPROSYN) 500 MG tablet Take 1 tablet (500 mg total) by mouth 2 (two) times daily. 03/20/18   Zigmund Gottron, NP  Prenatal Vit-DSS-Fe Fum-FA (SE-NATAL 19) 29-1 MG TABS TAKE 1 TABLET BY MOUTH DAILY 03/12/17   [name], MD  promethazine (PHENERGAN) 25 MG tablet TAKE 1 TABLET BY MOUTH EVERY 6 HOURS AS NEEDED FOR NAUSEA AND VOMITING 03/12/17   [name], MD    Family History Family History  Problem Relation Age of Onset  . Diabetes Maternal Grandmother   . Thyroid disease Maternal Aunt   . Glucose-6-phos deficiency Son     Social History Social History   Tobacco Use  . Smoking status: Never Smoker  . Smokeless tobacco: Never Used  Substance Use Topics  . Alcohol use: No    Alcohol/week: 0.0 standard drinks  . Drug use: No     Allergies   Patient has no known allergies.   Review of Systems Review of Systems   Physical Exam Triage Vital Signs ED Triage Vitals [03/20/18 1225]  Enc  Vitals Group     BP 116/75     Pulse Rate 74     Resp 16     Temp 98 F (36.7 C)     Temp src      SpO2 100 %     Weight      Height      Head Circumference      Peak Flow      Pain Score      Pain Loc      Pain Edu?      Excl. in [place]?    No data found.  Updated Vital Signs BP 116/75   Pulse 74   Temp 98 F (36.7 C)   Resp 16   SpO2 100%   Breastfeeding? Yes    Physical Exam  Constitutional: She is oriented to person, place, and time. She appears well-developed and well-nourished. No distress.  Cardiovascular: Normal rate, regular rhythm and normal heart sounds.  Pulmonary/Chest: Effort normal and breath sounds normal.  Musculoskeletal:       Lumbar back: She exhibits tenderness and pain. She exhibits normal range of motion, no bony  tenderness, no swelling, no edema, no deformity, no laceration, no spasm and normal pulse.       Back:  Right low back with tenderness on palpation and with bilateral hip flexion, pain with right leg straight leg raise; no bony tenderness, step off or deformity; ambulatory without difficulty; strength equal bilaterally; gross sensation intact   Neurological: She is alert and oriented to person, place, and time.  Skin: Skin is warm and dry.     UC Treatments / Results  Labs (all labs ordered are listed, but only abnormal results are displayed) Labs Reviewed - No data to display  EKG None  Radiology No results found.  Procedures Procedures (including critical care time)  Medications Ordered in UC Medications - No data to display  Initial Impression / Assessment and Plan / UC Course  I have reviewed the triage vital signs and the nursing notes.  Pertinent labs & imaging results that were available during my care of the patient were reviewed by me and considered in my medical decision making (see chart for details).     No red flag findings on exam. Consistent with muscle strain. Ambulatory without difficulty. Nsaids, muscle relaxer as needed. Drowsy precautions provided. Encouraged follow up with PCP as needed. Patient verbalized understanding and agreeable to plan.  Ambulatory out of clinic without difficulty.    Final Clinical Impressions(s) / UC Diagnoses   Final diagnoses:  Acute right-sided low back pain without sciatica     Discharge Instructions     Light and regular activity as tolerated.  Avoid heavy lifting or bending.  Naproxen twice a day, take with food. Stop ibuprofen.  Muscle relaxer as needed as previously prescribed.  Please follow up with your primary care provider as needed for persistent symptoms.    ED Prescriptions    Medication Sig Dispense Auth. Provider   naproxen (NAPROSYN) 500 MG tablet Take 1 tablet (500 mg total) by mouth 2 (two) times  daily. 30 tablet Zigmund Gottron, NP     Controlled Substance Prescriptions [place] Controlled Substance Registry consulted? Not Applicable   Zigmund Gottron, NP 03/20/18 1312

## 2018-03-20 NOTE — ED Triage Notes (Signed)
Pt c/o lower back pain since yesterday, painful with bending over.

## 2018-09-03 NOTE — Telephone Encounter (Signed)
Error

## 2018-10-16 ENCOUNTER — Telehealth: Payer: Self-pay | Admitting: Family Medicine

## 2018-10-16 NOTE — Telephone Encounter (Signed)
Received a voice message from this patient's name and phone [phone]. No DOB left on recording, was not real clear on the recording this was the patient calling. However, her name came in our mailbox with this number which there was no answer and unable to leave a message. No medication name was identified in the message, either.

## 2019-01-08 ENCOUNTER — Ambulatory Visit (HOSPITAL_COMMUNITY)
Admission: EM | Admit: 2019-01-08 | Discharge: 2019-01-08 | Disposition: A | Discharge status: Home / Self Care | Payer: BLUE CROSS/BLUE SHIELD | Attending: Internal Medicine | Admitting: Internal Medicine

## 2019-01-08 ENCOUNTER — Encounter (HOSPITAL_COMMUNITY): Payer: Self-pay | Admitting: Emergency Medicine

## 2019-01-08 ENCOUNTER — Other Ambulatory Visit: Payer: Self-pay

## 2019-01-08 DIAGNOSIS — O9123 Nonpurulent mastitis associated with lactation: Secondary | ICD-10-CM | POA: Diagnosis not present

## 2019-01-08 MED ORDER — IBUPROFEN 400 MG PO TABS: 400.0000 mg | ORAL_TABLET | Freq: Four times a day (QID) | ORAL | 0 refills | Status: AC | PRN

## 2019-01-08 MED ORDER — CEPHALEXIN 500 MG PO CAPS: 500.0000 mg | ORAL_CAPSULE | Freq: Three times a day (TID) | ORAL | 0 refills | Status: AC

## 2019-01-08 NOTE — ED Triage Notes (Signed)
Left breast pain started 2 days ago.  Patient started having chills and feeling feverish last night. Patient currently is breast feeding and has used the pump.  History of the same, but never saw a doctor for this.

## 2019-01-08 NOTE — ED Notes (Signed)
Nurse at bedside for provider exam

## 2019-01-08 NOTE — ED Provider Notes (Signed)
[facility]    CSN: [id number] Arrival date & time: 01/08/19  1125      History   Chief Complaint Chief Complaint  Patient presents with  . Breast Pain    HPI Shelley Hardy is a 43 y.o. female comes to urgent care with complaints of breast pain over the past 2 days.  Patient is lactating mother to a child of 61 years old.  Pain is in the left breast, moderate severity, aggravated by palpation.  She has not tried any over-the-counter medications.  Patient's child typically feeds on the right breast because of inverted nipple on the left.  She usually uses a breast pump to express milk from the left breast.  HPI  Past Medical History:  Diagnosis Date  . AMA (advanced maternal age) multigravida 38+   . Pituitary tumor     Patient Active Problem List   Diagnosis Date Noted  . Gestational thrombocytopenia ([place]) 01/25/2017  . Unstable lie of fetus 01/25/2017  . AMA (advanced maternal age) multigravida 35+ 07/05/2016  . Supervision of elderly multigravida (>=23 years old at time of delivery) 07/05/2016  . Aborter, habitual, antepartum 07/05/2016  . Hypertriglyceridemia 11/15/2015  . H/O: pituitary tumor 11/17/2014  . Depression 11/17/2014  . 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 Goiter 08/07/2013    Past Surgical History:  Procedure Laterality Date  . NO PAST SURGERIES      OB History    Gravida  8   Para  4   Term  4   Preterm  0   AB  4   Living  4     SAB  4   TAB  0   Ectopic  0   Multiple  0   Live Births  4            Home Medications    Prior to Admission medications   Medication Sig Start Date End Date Taking? Authorizing Provider  naproxen sodium (ALEVE) 220 MG tablet Take 220 mg by mouth.   Yes [provider]  Prenatal Vit-DSS-Fe Fum-FA (SE-NATAL 19) 29-1 MG TABS TAKE 1 TABLET BY MOUTH DAILY 03/12/17  Yes [name], MD  cephALEXin (KEFLEX) 500 MG capsule Take 1 capsule (500 mg total) by mouth 3 (three)  times daily. 01/08/19   Chase Picket, MD  famotidine (PEPCID) 20 MG tablet Take 20 mg by mouth 2 (two) times daily as needed for heartburn or indigestion.    [provider]  hydrocortisone (ANUSOL-HC) 25 MG suppository Place 1 suppository (25 mg total) rectally 2 (two) times daily. 12/20/16   Woodroe Mode, MD  ibuprofen (ADVIL) 400 MG tablet Take 1 tablet (400 mg total) by mouth every 6 (six) hours as needed. 01/08/19   , Myrene Galas, MD  naproxen (NAPROSYN) 500 MG tablet Take 1 tablet (500 mg total) by mouth 2 (two) times daily. 03/20/18   Zigmund Gottron, NP  promethazine (PHENERGAN) 25 MG tablet TAKE 1 TABLET BY MOUTH EVERY 6 HOURS AS NEEDED FOR NAUSEA AND VOMITING 03/12/17   [name], MD    Family History Family History  Problem Relation Age of Onset  . Diabetes Maternal Grandmother   . Thyroid disease Maternal Aunt   . Glucose-6-phos deficiency Son     Social History Social History   Tobacco Use  . Smoking status: Never Smoker  . Smokeless tobacco: Never Used  Substance Use Topics  . Alcohol use: No    Alcohol/week: 0.0  standard drinks  . Drug use: No     Allergies   Patient has no known allergies.   Review of Systems Review of Systems  Constitutional: Negative for activity change, appetite change, chills, fatigue and fever.  HENT: Negative.   Eyes: Negative.   Respiratory: Negative.   Cardiovascular: Positive for chest pain.  Gastrointestinal: Negative.  Negative for diarrhea, nausea and vomiting.  Genitourinary: Negative.   Musculoskeletal: Negative for myalgias and neck pain.  Skin: Positive for color change. Negative for rash and wound.     Physical Exam Triage Vital Signs ED Triage Vitals  Enc Vitals Group     BP 01/08/19 1214 110/74     Pulse Rate 01/08/19 1214 (!) 106     Resp 01/08/19 1214 18     Temp 01/08/19 1214 99.7 F (37.6 C)     Temp Source 01/08/19 1214 Oral     SpO2 01/08/19 1214 100 %     Weight --      Height  --      Head Circumference --      Peak Flow --      Pain Score 01/08/19 1210 9     Pain Loc --      Pain Edu? --      Excl. in [place]? --    No data found.  Updated Vital Signs BP 110/74 (BP Location: Right Arm)   Pulse (!) 106   Temp 99.7 F (37.6 C) (Oral)   Resp 18   SpO2 100%   Visual Acuity Right Eye Distance:   Left Eye Distance:   Bilateral Distance:    Right Eye Near:   Left Eye Near:    Bilateral Near:     Physical Exam Neck:     Musculoskeletal: Normal range of motion.  Cardiovascular:     Rate and Rhythm: Normal rate and regular rhythm.  Pulmonary:     Effort: Pulmonary effort is normal.     Breath sounds: Normal breath sounds.  Abdominal:     General: Bowel sounds are normal.     Palpations: Abdomen is soft.  Musculoskeletal: Normal range of motion.     Comments: Mild erythema in the left breast.  Tender to palpation.  Not warm to touch.  No nipple discharge.  Induration in the areolar area.  No palpable breast mass.  Skin:    Capillary Refill: Capillary refill takes less than 2 seconds.  Neurological:     General: No focal deficit present.     Mental Status: She is oriented to person, place, and time.      UC Treatments / Results  Labs (all labs ordered are listed, but only abnormal results are displayed) Labs Reviewed - No data to display  EKG None  Radiology No results found.  Procedures Procedures (including critical care time)  Medications Ordered in UC Medications - No data to display  Initial Impression / Assessment and Plan / UC Course  I have reviewed the triage vital signs and the nursing notes.  Pertinent labs & imaging results that were available during my care of the patient were reviewed by me and considered in my medical decision making (see chart for details).     1.  Lactational mastitis: Cold compress Nonsteroidal anti-inflammatory agents If no improvement in 24-48 hours, patient can start Keflex Prescription of  Keflex was given to patient Final Clinical Impressions(s) / UC Diagnoses   Final diagnoses:  Nonpurulent mastitis associated with lactation   Discharge  Instructions   None    ED Prescriptions    Medication Sig Dispense Auth. Provider   cephALEXin (KEFLEX) 500 MG capsule Take 1 capsule (500 mg total) by mouth 3 (three) times daily. 21 capsule , Myrene Galas, MD   ibuprofen (ADVIL) 400 MG tablet Take 1 tablet (400 mg total) by mouth every 6 (six) hours as needed. 30 tablet , Myrene Galas, MD     Controlled Substance Prescriptions [place] Controlled Substance Registry consulted? No   Chase Picket, MD 01/08/19 [phone]

## 2021-03-07 ENCOUNTER — Other Ambulatory Visit: Payer: Self-pay | Admitting: Internal Medicine

## 2021-03-07 DIAGNOSIS — E042 Nontoxic multinodular goiter: Secondary | ICD-10-CM

## 2021-03-09 ENCOUNTER — Ambulatory Visit
Admission: RE | Admit: 2021-03-09 | Discharge: 2021-03-09 | Disposition: A | Discharge status: Home / Self Care | Payer: 59 | Source: Ambulatory Visit | Attending: Internal Medicine | Admitting: Internal Medicine

## 2021-03-09 DIAGNOSIS — E042 Nontoxic multinodular goiter: Secondary | ICD-10-CM

## 2021-07-19 ENCOUNTER — Other Ambulatory Visit: Payer: Self-pay | Admitting: Internal Medicine

## 2021-07-19 DIAGNOSIS — E042 Nontoxic multinodular goiter: Secondary | ICD-10-CM

## 2021-07-28 ENCOUNTER — Other Ambulatory Visit: Payer: Self-pay | Admitting: Internal Medicine

## 2021-07-28 DIAGNOSIS — E042 Nontoxic multinodular goiter: Secondary | ICD-10-CM

## 2021-08-22 ENCOUNTER — Other Ambulatory Visit: Payer: Self-pay | Admitting: Internal Medicine

## 2021-08-22 DIAGNOSIS — Z1231 Encounter for screening mammogram for malignant neoplasm of breast: Secondary | ICD-10-CM

## 2021-09-01 ENCOUNTER — Ambulatory Visit: Payer: Self-pay

## 2021-09-13 ENCOUNTER — Other Ambulatory Visit: Payer: Self-pay

## 2022-05-15 ENCOUNTER — Other Ambulatory Visit: Payer: Self-pay | Admitting: *Deleted

## 2022-05-15 DIAGNOSIS — Z1231 Encounter for screening mammogram for malignant neoplasm of breast: Secondary | ICD-10-CM

## 2022-07-11 ENCOUNTER — Ambulatory Visit: Payer: Self-pay

## 2024-02-21 ENCOUNTER — Other Ambulatory Visit: Payer: Self-pay | Admitting: Family Medicine

## 2024-02-21 DIAGNOSIS — Z1231 Encounter for screening mammogram for malignant neoplasm of breast: Secondary | ICD-10-CM

## 2024-03-06 ENCOUNTER — Ambulatory Visit: Payer: Self-pay

## 2024-03-27 ENCOUNTER — Ambulatory Visit
